# Patient Record
Sex: Female | Born: 1935 | Race: White | Hispanic: No | Marital: Married | Smoking: Former smoker
Health system: Southern US, Community
[De-identification: ages and names within clinical notes are randomized; demographics above are authoritative.]

## PROBLEM LIST (undated history)

## (undated) DIAGNOSIS — F329 Major depressive disorder, single episode, unspecified: Secondary | ICD-10-CM

## (undated) DIAGNOSIS — F419 Anxiety disorder, unspecified: Secondary | ICD-10-CM

## (undated) DIAGNOSIS — K56609 Unspecified intestinal obstruction, unspecified as to partial versus complete obstruction: Secondary | ICD-10-CM

## (undated) DIAGNOSIS — G4730 Sleep apnea, unspecified: Secondary | ICD-10-CM

## (undated) DIAGNOSIS — F32A Depression, unspecified: Secondary | ICD-10-CM

## (undated) DIAGNOSIS — I10 Essential (primary) hypertension: Secondary | ICD-10-CM

## (undated) DIAGNOSIS — R911 Solitary pulmonary nodule: Secondary | ICD-10-CM

## (undated) DIAGNOSIS — E782 Mixed hyperlipidemia: Secondary | ICD-10-CM

## (undated) DIAGNOSIS — M1990 Unspecified osteoarthritis, unspecified site: Secondary | ICD-10-CM

## (undated) DIAGNOSIS — G309 Alzheimer's disease, unspecified: Secondary | ICD-10-CM

## (undated) DIAGNOSIS — F0280 Dementia in other diseases classified elsewhere without behavioral disturbance: Secondary | ICD-10-CM

## (undated) DIAGNOSIS — J449 Chronic obstructive pulmonary disease, unspecified: Secondary | ICD-10-CM

## (undated) DIAGNOSIS — K589 Irritable bowel syndrome without diarrhea: Secondary | ICD-10-CM

## (undated) HISTORY — DX: Irritable bowel syndrome without diarrhea: K58.9

## (undated) HISTORY — DX: Chronic obstructive pulmonary disease, unspecified: J44.9

## (undated) HISTORY — DX: Sleep apnea, unspecified: G47.30

## (undated) HISTORY — PX: COLONOSCOPY: SHX174

## (undated) HISTORY — DX: Essential (primary) hypertension: I10

## (undated) HISTORY — DX: Unspecified intestinal obstruction, unspecified as to partial versus complete obstruction: K56.609

## (undated) HISTORY — DX: Solitary pulmonary nodule: R91.1

## (undated) HISTORY — PX: ROTATOR CUFF REPAIR: SHX139

## (undated) HISTORY — DX: Anxiety disorder, unspecified: F41.9

## (undated) HISTORY — DX: Dementia in other diseases classified elsewhere, unspecified severity, without behavioral disturbance, psychotic disturbance, mood disturbance, and anxiety: F02.80

## (undated) HISTORY — DX: Depression, unspecified: F32.A

## (undated) HISTORY — PX: BILATERAL OOPHORECTOMY: SHX1221

## (undated) HISTORY — DX: Major depressive disorder, single episode, unspecified: F32.9

## (undated) HISTORY — DX: Alzheimer's disease, unspecified: G30.9

## (undated) HISTORY — DX: Unspecified osteoarthritis, unspecified site: M19.90

## (undated) HISTORY — DX: Mixed hyperlipidemia: E78.2

---

## 1972-07-18 HISTORY — PX: ABDOMINAL HYSTERECTOMY: SHX81

## 1998-10-07 ENCOUNTER — Ambulatory Visit (HOSPITAL_COMMUNITY): Admission: RE | Admit: 1998-10-07 | Discharge: 1998-10-07 | Payer: Self-pay | Admitting: Neurosurgery

## 1998-10-07 ENCOUNTER — Encounter: Payer: Self-pay | Admitting: Neurosurgery

## 1998-10-16 ENCOUNTER — Encounter: Payer: Self-pay | Admitting: Neurosurgery

## 1998-10-20 ENCOUNTER — Inpatient Hospital Stay (HOSPITAL_COMMUNITY): Admission: RE | Admit: 1998-10-20 | Discharge: 1998-10-21 | Payer: Self-pay | Admitting: Neurosurgery

## 1998-10-20 ENCOUNTER — Encounter: Payer: Self-pay | Admitting: Neurosurgery

## 2001-07-18 HISTORY — PX: CHOLECYSTECTOMY: SHX55

## 2002-04-04 ENCOUNTER — Encounter: Payer: Self-pay | Admitting: Family Medicine

## 2002-04-04 ENCOUNTER — Ambulatory Visit (HOSPITAL_COMMUNITY): Admission: RE | Admit: 2002-04-04 | Discharge: 2002-04-04 | Payer: Self-pay | Admitting: Family Medicine

## 2002-04-16 ENCOUNTER — Encounter: Payer: Self-pay | Admitting: Family Medicine

## 2002-04-16 ENCOUNTER — Ambulatory Visit (HOSPITAL_COMMUNITY): Admission: RE | Admit: 2002-04-16 | Discharge: 2002-04-16 | Payer: Self-pay | Admitting: Family Medicine

## 2002-06-06 ENCOUNTER — Ambulatory Visit (HOSPITAL_COMMUNITY): Admission: RE | Admit: 2002-06-06 | Discharge: 2002-06-06 | Payer: Self-pay | Admitting: Cardiology

## 2003-05-12 ENCOUNTER — Encounter: Payer: Self-pay | Admitting: Family Medicine

## 2003-05-12 ENCOUNTER — Ambulatory Visit (HOSPITAL_COMMUNITY): Admission: RE | Admit: 2003-05-12 | Discharge: 2003-05-12 | Payer: Self-pay | Admitting: Family Medicine

## 2003-07-16 ENCOUNTER — Ambulatory Visit (HOSPITAL_COMMUNITY): Admission: RE | Admit: 2003-07-16 | Discharge: 2003-07-16 | Payer: Self-pay | Admitting: Family Medicine

## 2003-08-19 ENCOUNTER — Inpatient Hospital Stay (HOSPITAL_BASED_OUTPATIENT_CLINIC_OR_DEPARTMENT_OTHER): Admission: RE | Admit: 2003-08-19 | Discharge: 2003-08-19 | Payer: Self-pay | Admitting: *Deleted

## 2003-12-08 ENCOUNTER — Encounter: Admission: RE | Admit: 2003-12-08 | Discharge: 2004-03-07 | Payer: Self-pay | Admitting: Anesthesiology

## 2004-08-26 ENCOUNTER — Ambulatory Visit (HOSPITAL_COMMUNITY): Admission: RE | Admit: 2004-08-26 | Discharge: 2004-08-26 | Payer: Self-pay | Admitting: Family Medicine

## 2004-09-02 ENCOUNTER — Ambulatory Visit (HOSPITAL_COMMUNITY): Admission: RE | Admit: 2004-09-02 | Discharge: 2004-09-02 | Payer: Self-pay | Admitting: Neurology

## 2004-09-20 ENCOUNTER — Encounter: Admission: RE | Admit: 2004-09-20 | Discharge: 2004-09-20 | Payer: Self-pay | Admitting: Oncology

## 2004-09-20 ENCOUNTER — Encounter (HOSPITAL_COMMUNITY): Admission: RE | Admit: 2004-09-20 | Discharge: 2004-10-20 | Payer: Self-pay | Admitting: Oncology

## 2004-09-20 ENCOUNTER — Ambulatory Visit (HOSPITAL_COMMUNITY): Payer: Self-pay | Admitting: Oncology

## 2004-10-15 ENCOUNTER — Ambulatory Visit: Payer: Self-pay | Admitting: Hematology & Oncology

## 2004-11-09 ENCOUNTER — Ambulatory Visit (HOSPITAL_COMMUNITY): Admission: RE | Admit: 2004-11-09 | Discharge: 2004-11-09 | Payer: Self-pay | Admitting: Hematology & Oncology

## 2004-11-09 ENCOUNTER — Encounter (INDEPENDENT_AMBULATORY_CARE_PROVIDER_SITE_OTHER): Payer: Self-pay | Admitting: Specialist

## 2004-11-11 ENCOUNTER — Ambulatory Visit: Payer: Self-pay | Admitting: Hematology & Oncology

## 2005-12-29 IMAGING — US US SOFT TISSUE HEAD/NECK
1 series · 14 of 25 positions shown · non-contrast
Comparison: none

CLINICAL DATA: Neck mass.  Evaluate for thyroid nodule. 
THYROID ULTRASOUND:
The thyroid gland is normal in size with the right lobe measuring 4.1 x 1 x 1.4 cm and the left lobe measuring 4.1 x 1 x 1 cm.  The isthmus measures 2 mm in thickness, which is normal. 
Homogeneous echo texture of the thyroid gland is seen.  No thyroid nodules or masses are identified within either thyroid lobe.  No abnormalities are seen in the adjacent extra-thyroidal soft tissues.

[Series 1: unknown · 0.07mm/px · 14 of 26 slices shown]
[im 1/26]
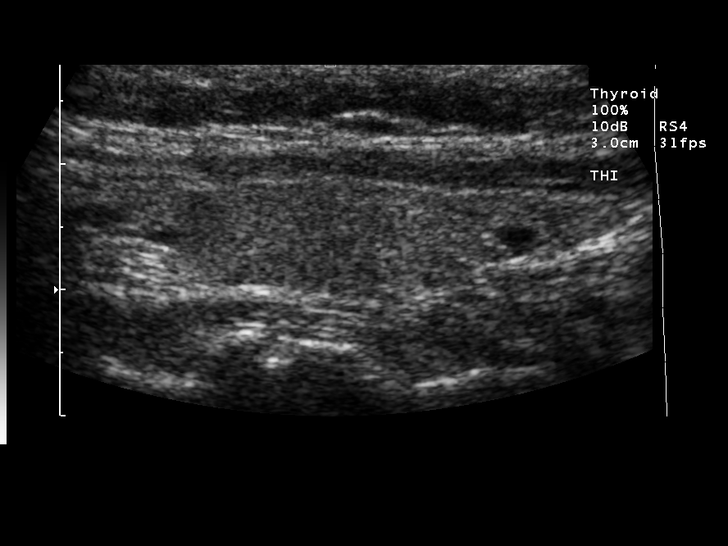
[im 3/26]
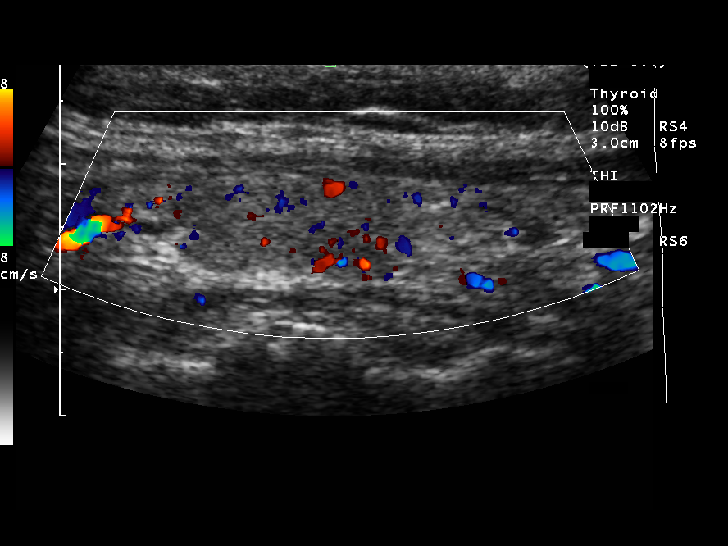
[im 5/26]
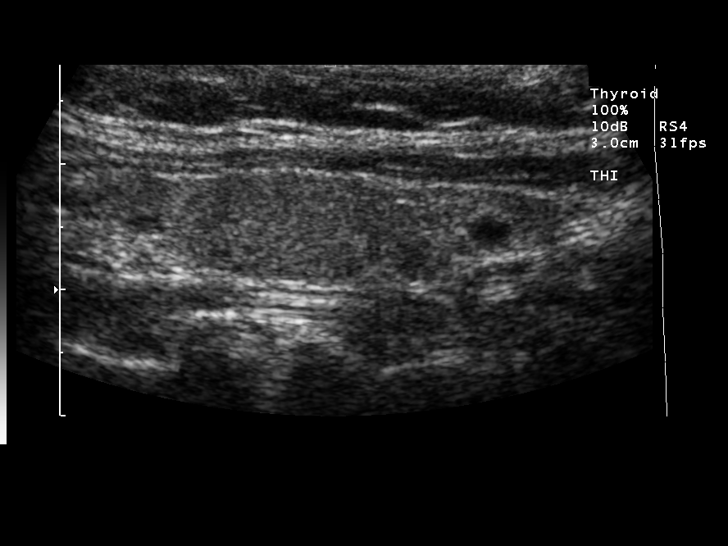
[im 7/26]
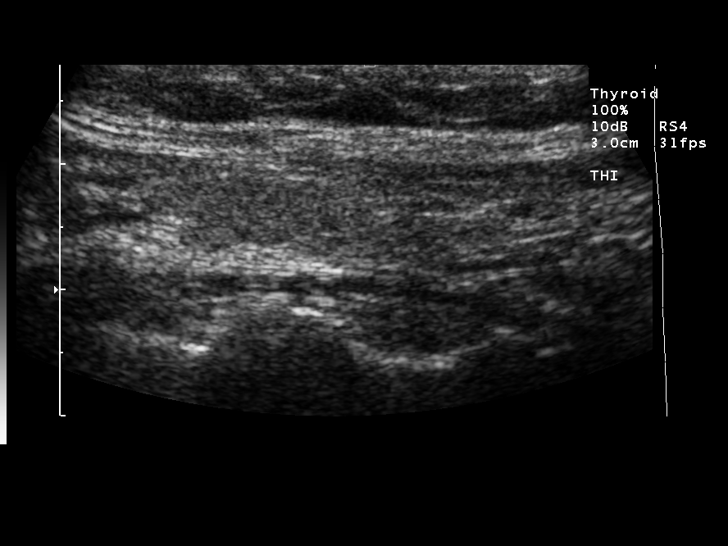
[im 9/26]
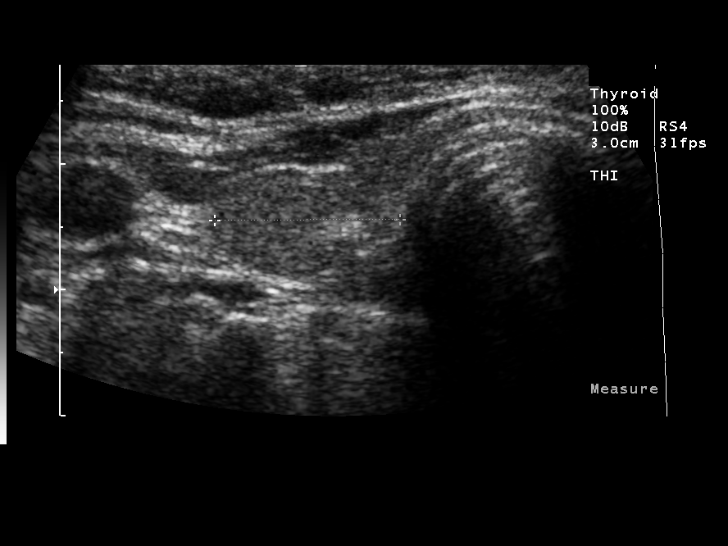
[im 10/26]
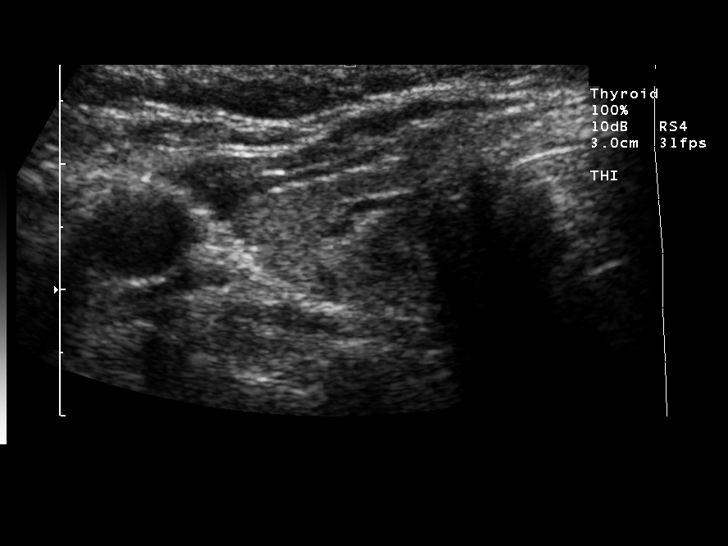
[im 12/26]
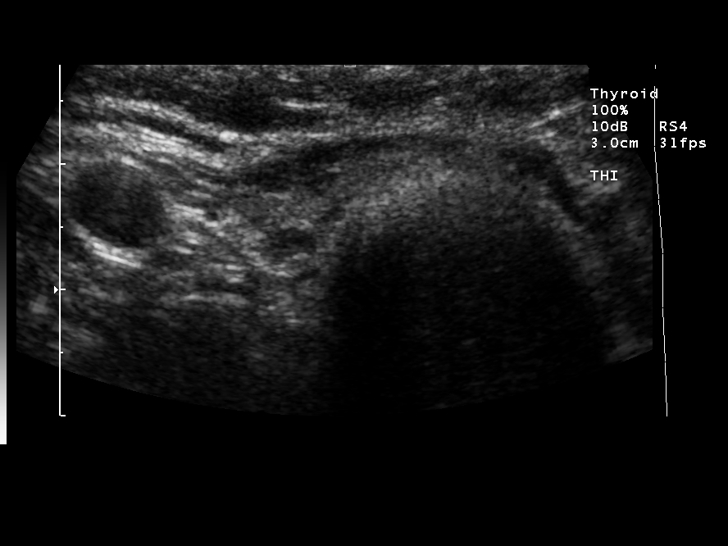
[im 14/26]
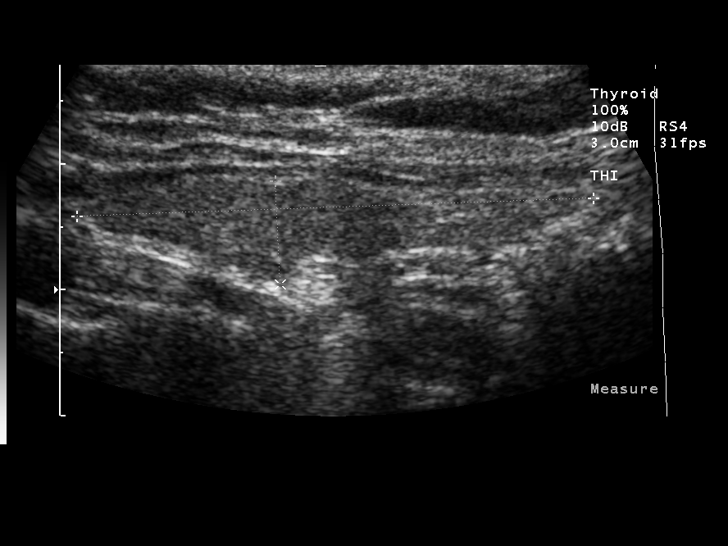
[im 16/26]
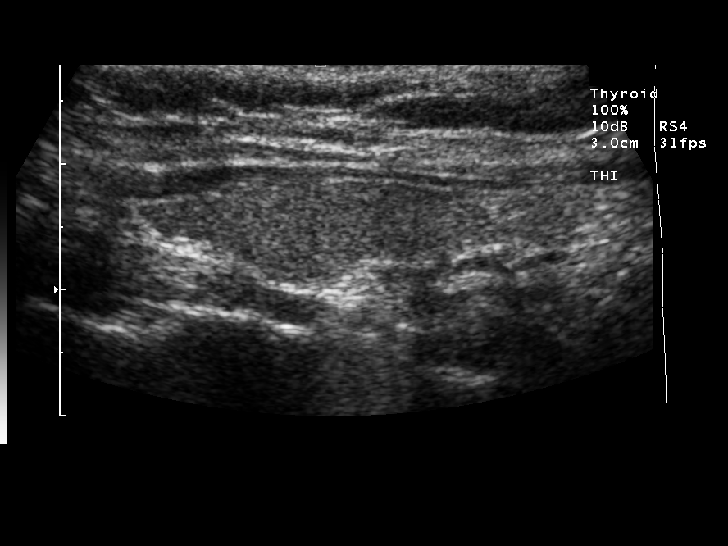
[im 17/26]
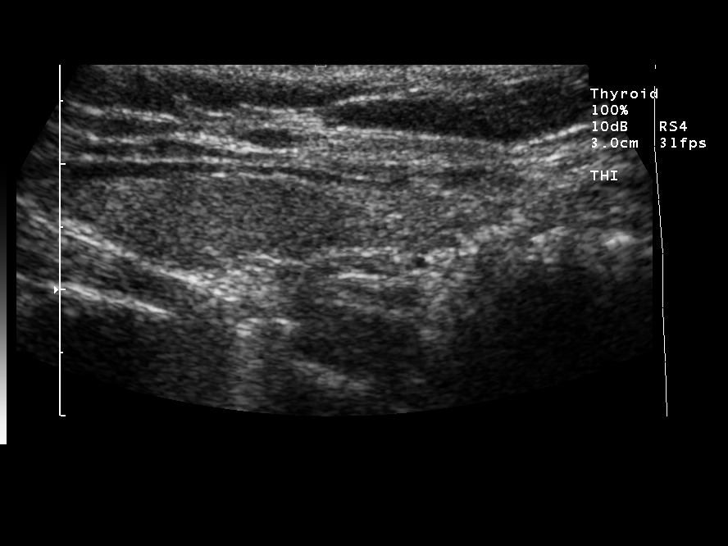
[im 19/26]
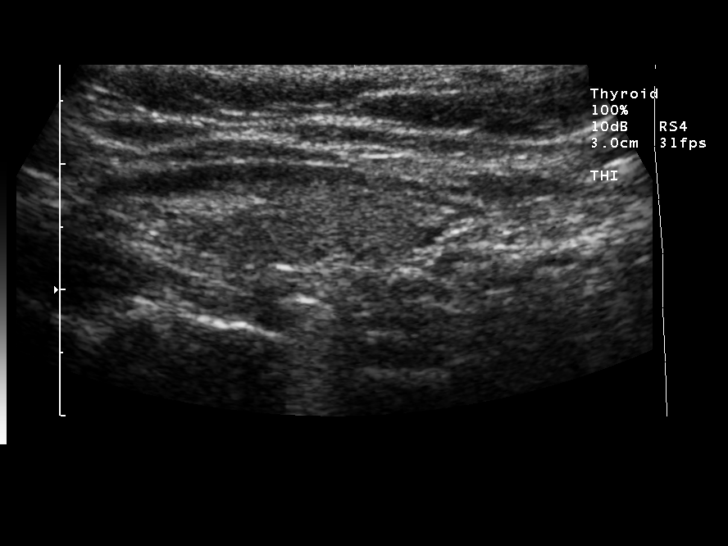
[im 21/26]
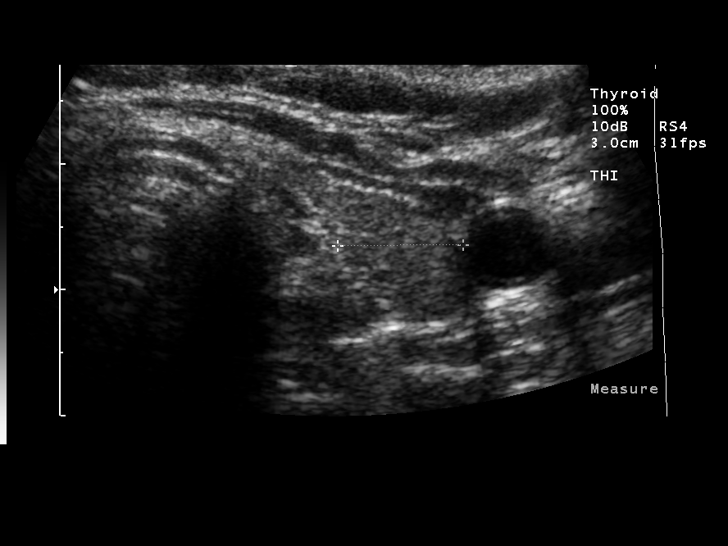
[im 23/26]
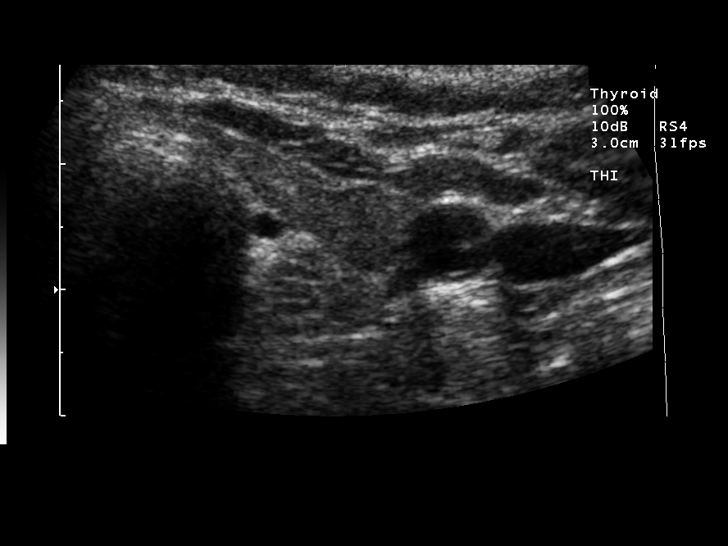
[im 26/26]
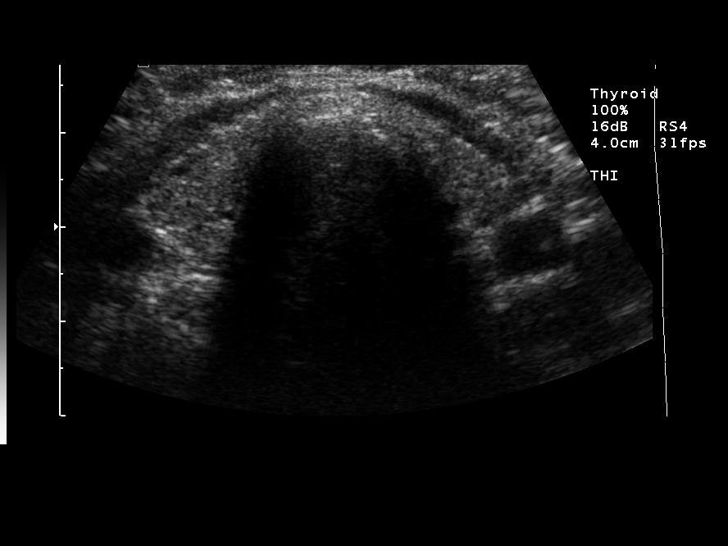

[14 of 25 positions shown; findings below may reference images not displayed]

IMPRESSION: Normal size and appearance of the thyroid gland.  If there is a persistent palpable abnormality in the neck, CT is recommended for further evaluation.

## 2006-01-19 IMAGING — CT CT CHEST W/ CM
1 of 4 series · 11 of 32 positions shown, 17 images · IV contrast (omnipaque)
Comparison: None.

CT CHEST

CLINICAL DATA: Weight loss, night sweats. Surgical history includes
cholecystectomy, hysterectomy and appendectomy.

CT OF THE CHEST, ABDOMEN, AND PELVIS WITH CONTRAST  09/23/2004:
TECHNIQUE: Multidetector CT imaging of the chest, abdomen, and pelvis was
performed during bolus administration of intravenous contrast. Oral contrast was
given. Delayed imaging through the kidneys and bladder was performed.
Contrast:  150 cc Omnipaque 300

[Series 2455: — · axial · 0.75mm/px · z∈[+1274,+1654]mm · 11 of 92 slices shown, 17 images]
[im 8/92  soft-tissue]
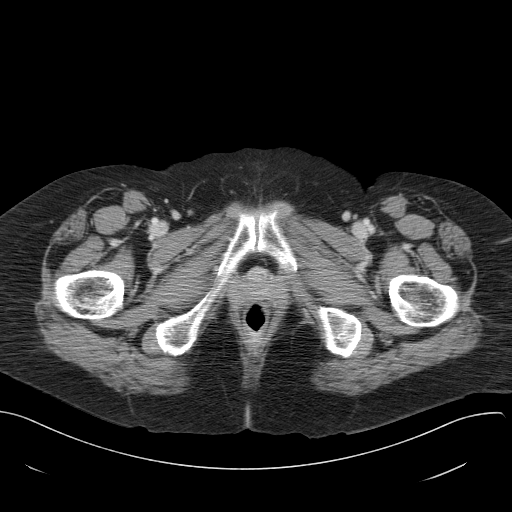
[im 8/92  bone]
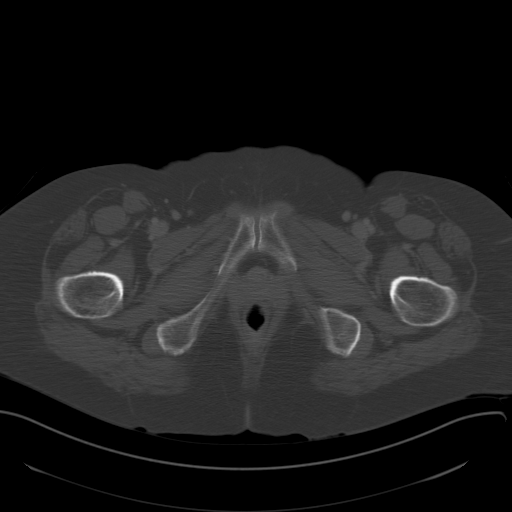
[im 16/92  soft-tissue]
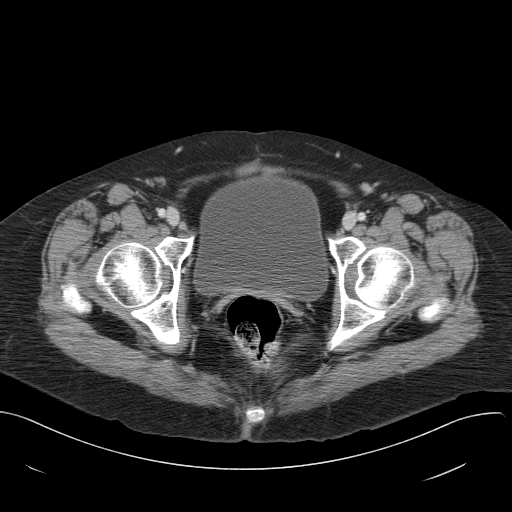
[im 23/92  soft-tissue]
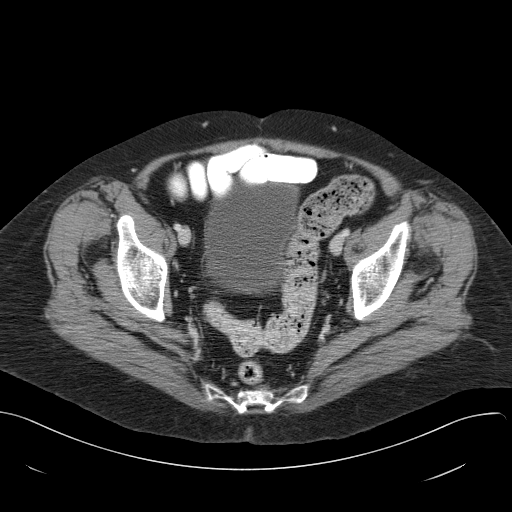
[im 31/92  soft-tissue]
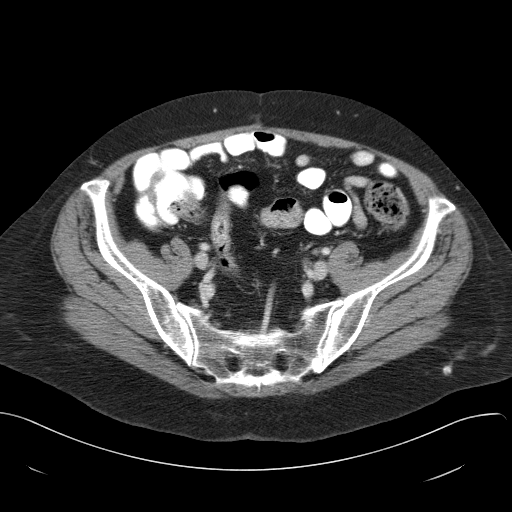
[im 38/92  soft-tissue]
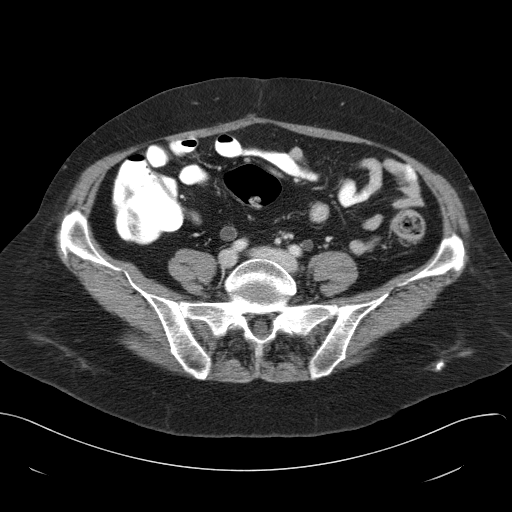
[im 46/92  soft-tissue]
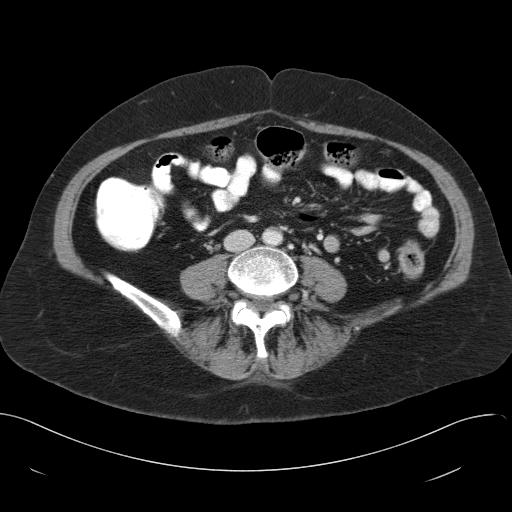
[im 54/92  soft-tissue]
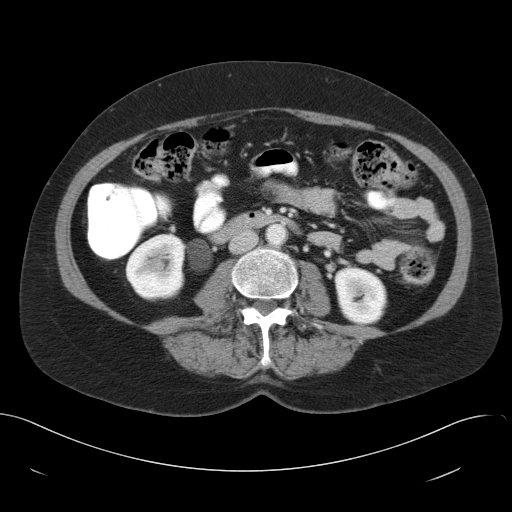
[im 61/92  soft-tissue]
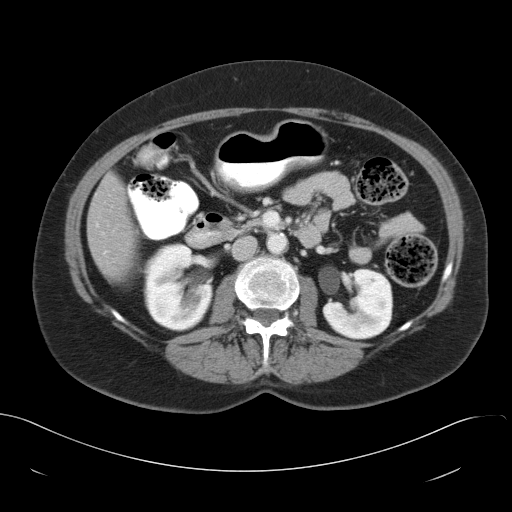
[im 61/92  lung]
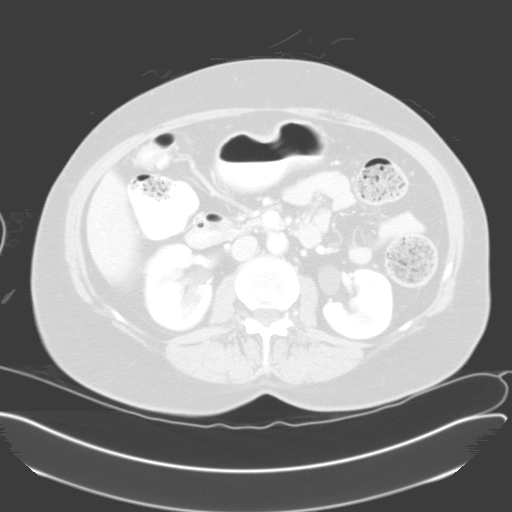
[im 69/92  soft-tissue]
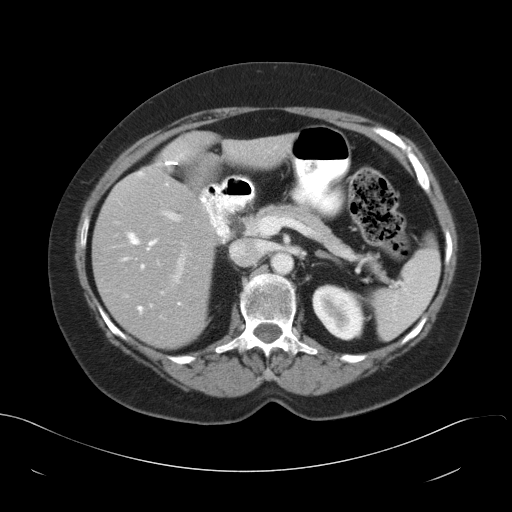
[im 69/92  lung]
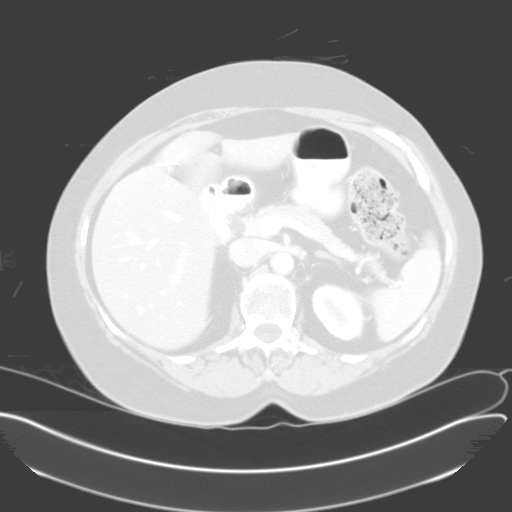
[im 69/92  bone]
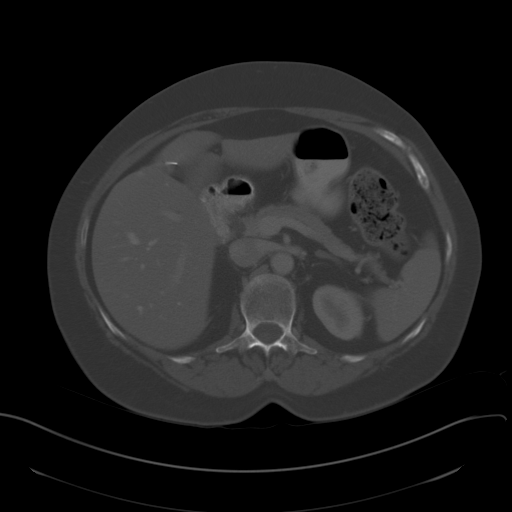
[im 76/92  soft-tissue]
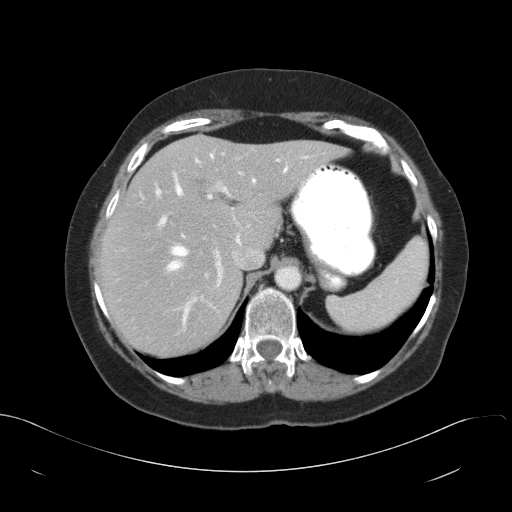
[im 76/92  lung]
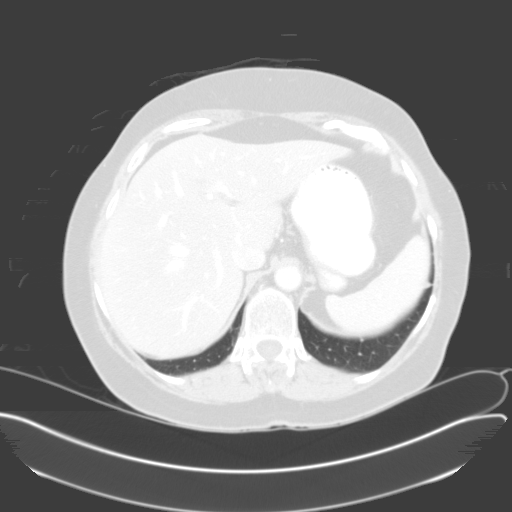
[im 84/92  soft-tissue]
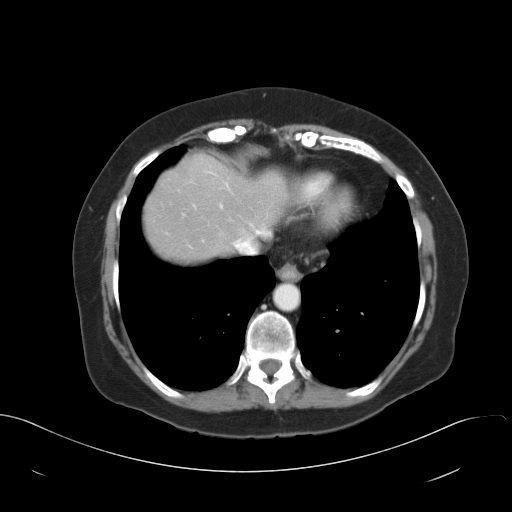
[im 84/92  lung]
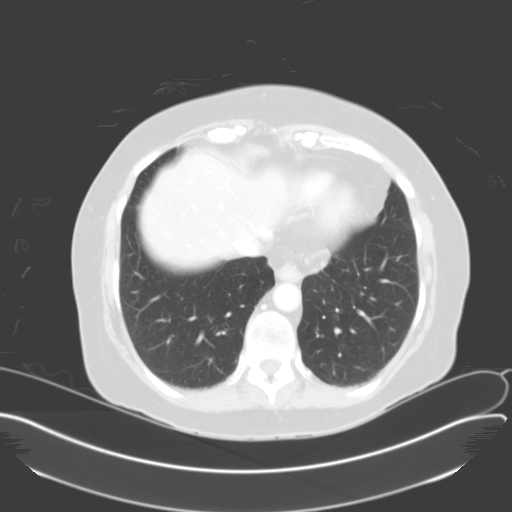

[11 of 32 positions shown; findings below may reference images not displayed]

FINDINGS: There is no significant mediastinal, hilar, or axillary
lymphadenopathy. Heart size is upper normal. There is no pericardial effusion.
There are no pleural effusions.

Emphysematous changes are present throughout both lungs. There is a nodule in
the right middle lobe which measures approximately 8 mm. No other nodules are
identified. The lungs are clear otherwise. The bone window images demonstrate
thoracic spondylosis and old healed fractures involving the left 7th, 8th and
9th anterior ribs.
IMPRESSION: 1. Solitary nonspecific nodule in the right middle lobe measuring 8 mm. This is
at the lower limits of detectability by PET. This would also be a difficult
nodule for percutaneous biopsy.

2. Emphysema. No acute cardiopulmonary disease otherwise.

CT ABDOMEN
FINDINGS: A small hiatal hernia is present. The visualized colon and small
bowel are unremarkable. There is mild diffuse fatty infiltration of the liver.
There is a calcified granuloma in the anterior segment of the right lobe near
the dome. No significant focal abnormalities are identified in the liver. The
spleen and pancreas are normal in appearance. Both adrenal glands are normal.
There are tiny simple cysts in the mid left kidney and in the upper pole of the
right kidney. Extrarenal pelves are present bilaterally. There are no
significant abnormalities involving either kidney. There is no significant
lymphadenopathy. Minimal aortic atherosclerosis is present. There is no free
fluid.
IMPRESSION: 1. Small hiatal hernia. 

2. Mild diffuse fatty infiltration of the liver.

3. No significant abnormalities otherwise.

CT PELVIS
FINDINGS: There is a large amount of stool present in the rectosigmoid colon.
The small bowel is unremarkable. The uterus is surgically absent. No pelvic
masses are identified. The urinary bladder is normal. No free fluid is present.
There is a calcified injection granuloma in the left buttock.
IMPRESSION: No significant abnormalities in the pelvis apart from probable constipation.

## 2008-11-18 ENCOUNTER — Ambulatory Visit: Payer: Self-pay | Admitting: Cardiology

## 2008-11-21 ENCOUNTER — Inpatient Hospital Stay (HOSPITAL_BASED_OUTPATIENT_CLINIC_OR_DEPARTMENT_OTHER): Admission: RE | Admit: 2008-11-21 | Discharge: 2008-11-21 | Payer: Self-pay | Admitting: Cardiovascular Disease

## 2008-11-21 ENCOUNTER — Ambulatory Visit: Payer: Self-pay | Admitting: Cardiovascular Disease

## 2008-12-09 ENCOUNTER — Ambulatory Visit: Payer: Self-pay | Admitting: Cardiology

## 2009-04-10 DIAGNOSIS — R0602 Shortness of breath: Secondary | ICD-10-CM | POA: Insufficient documentation

## 2009-04-10 DIAGNOSIS — R079 Chest pain, unspecified: Secondary | ICD-10-CM

## 2010-08-08 ENCOUNTER — Encounter: Payer: Self-pay | Admitting: Family Medicine

## 2010-10-26 LAB — POCT I-STAT 3, VENOUS BLOOD GAS (G3P V)
Acid-base deficit: 3 mmol/L — ABNORMAL HIGH (ref 0.0–2.0)
Bicarbonate: 23.7 mEq/L (ref 20.0–24.0)
O2 Saturation: 62 %
pCO2, Ven: 45.7 mmHg (ref 45.0–50.0)
pO2, Ven: 35 mmHg (ref 30.0–45.0)

## 2010-10-26 LAB — POCT I-STAT 3, ART BLOOD GAS (G3+)
TCO2: 26 mmol/L (ref 0–100)
pCO2 arterial: 40.6 mmHg (ref 35.0–45.0)
pH, Arterial: 7.393 (ref 7.350–7.400)

## 2010-11-30 NOTE — Cardiovascular Report (Signed)
Madeline Hayes, DLUGOSZ                 ACCOUNT NO.:  [id number]   MEDICAL RECORD NO.:  [id number]          PATIENT TYPE:  OIB   LOCATION:  1961                         FACILITY:  [facility]   PHYSICIAN:  Verne Carrow, MDDATE OF BIRTH:  1935/12/26   DATE OF PROCEDURE:  DATE OF DISCHARGE:  11/21/2008                            CARDIAC CATHETERIZATION   PRIMARY CARDIOLOGIST:  Jonelle Sidle, MD   PRIMARY CARE PHYSICIAN:  Dr. Linward Foster.   PROCEDURE PERFORMED:  1. Right heart catheterization.  2. Left heart catheterization.  3. Left coronary angiography.  4. Left ventricular angiogram.   OPERATOR:  Verne Carrow, MD   INDICATIONS:  Shortness of breath in a 75 year old patient with no known  prior cardiac disease who has a history of COPD.  The right and left  heart catheterization has been ordered today to rule out obstructive  coronary artery disease to assess for pulmonary artery pressures  basically.   DETAILS OF PROCEDURE:  The patient was brought into the outpatient  cardiac catheterization laboratory after signing informed consent for  the procedure.  The right groin was prepped and draped in a sterile  fashion.  Lidocaine 1% was used for local anesthesia.  A 4-French sheath  was inserted into the right femoral artery.  A 7-French sheath was  inserted into the right femoral vein.  A Swan-Ganz catheter was used to  perform the right heart catheterization.  Standard diagnostic catheters  were used to perform the selective coronary angiography.  A 4-French  pigtail catheter was used to perform a left ventricular angiogram.  There was no significant pressure gradient noted across the aortic valve  on pullback of the catheter.  The patient tolerated the procedure well  and was taken to the holding area in stable condition.   HEMODYNAMIC FINDINGS:  Right atrial pressure 9.  Right ventricular  pressure 47/7.  Right ventricular end-diastolic pressure 15.  Pulmonary  artery pressure 43/18 with a mean of 29.  Pulmonary capillary wedge  pressure mean of 17.  Left ventricular pressure 138/18.  Left  ventricular end-diastolic pressure 22.  Central aortic pressure 134/68.  Cardiac output 4.5 L per minute (by Fick method).  Cardiac index 2.2 L  per minute per meter squared.  Aortic saturation 93%.  Pulmonary artery  saturation 62%.   ANGIOGRAPHIC FINDINGS:  1. The left main coronary artery has no evidence of disease.  2. Left anterior descending artery is a large vessel that courses to      the apex and gives off a large early diagonal branch.  There is no      obstructive disease noted in the diagonal branch or in the LAD      system.  3. The circumflex artery is a moderate-sized vessel that gives off a      moderate-sized first marginal branch, a small second marginal      branch, and a moderate-sized third marginal branch.  There is no      obstructive disease noted at this system.  4. The right coronary artery is small vessel that for provides small  posterior descending artery.  There is no obstructive disease noted      at this system.  5. Left ventricular angiogram was performed in the RAO projection that      shows normal left ventricular systolic function with ejection      fraction of 65%.  There was no evidence of significant mitral      regurgitation.   IMPRESSION:  1. No angiographic evidence of coronary artery disease.  2. Normal left ventricular systolic function.  3. Mild elevation of pulmonary artery pressures.   RECOMMENDATIONS:  I do not recommend any further cardiac workup at this  time.  The patient will have follow up arranged with Dr. Diona Browner in 1-2  weeks.      Verne Carrow, MD  Electronically Signed     CM/MEDQ  D:  11/21/2008  T:  11/22/2008  Job:  [id number]   cc:   Jonelle Sidle, MD

## 2010-11-30 NOTE — Assessment & Plan Note (Signed)
[facility]                          [facility]ICE NOTE   JONNI, OELKERS                        MRN:          [id number]  DATE:11/18/2008                            DOB:          [birth year]    PRIMARY CARE PHYSICIAN:  Linward Foster, M.D.   REASON FOR CONSULTATION:  Progressive shortness of breath and  intermittent chest pain.   HISTORY OF PRESENT ILLNESS:  Madeline Hayes is a 75 year old woman with a  history of emphysematous lung disease, hypertension, hyperlipidemia, and  depression who was evaluated by our practice initially back in 2003.  She had undergone a noninvasive myocardial perfusion study at that time  that demonstrated possible mild lateral ischemia and ultimately  underwent a diagnostic cardiac catheterization in November of 2003.  This study revealed no significant flow-limiting coronary  atherosclerosis in the major epicardial vessels with an ejection  fraction of 70-75%.  She was subsequently evaluated in 2005 with a right  heart catheterization showing only mildly elevated pulmonary artery  systolic pressure of 39 with mean pulmonary capillary wedge pressure of  9, normal cardiac output of 4.8 liters per minute, and no evidence of  constriction or restriction based on simultaneous LV and RV pressures.  She had been referred at that time with a history of progressive  shortness of breath.   Since then, we have not seen her with any regularity, and she has been  followed by Dr. Gerhard Munch and Dr. Egbert Garibaldi.  The patient states that over the  last 3 years she has been experiencing progressive shortness of  breath, as well as intermittent chest pain.  She describes a very  sporadic chest discomfort, moderate in intensity, and lasting anywhere  from 15-20 minutes.  This is described as a pressure, and sometimes it  improves with her DuoNeb treatments; not consistently, however.  These  symptoms are not purely exertional.  She states  that she becomes  exhausted when she does activities of daily living such as working  around her house and making of her bed.  She had an electrocardiogram  done recently showing normal sinus rhythm with nonspecific ST-T wave  changes.  I see that she is also being followed for a right middle lobe  nodule that has been stable, most recently assessed by CT angiography on  October 20, 2008.  She has an 8 mm right middle lobe nodule without any  mediastinal or hilar lymphadenopathy.  Lung parenchyma imaging shows  evidence of central lobular emphysema.  She is not reporting any cough  or hemoptysis.  She does report being under a lot of stress and has  trouble with anxiety, as well.   ALLERGIES:  1. PENICILLIN.  2. CODEINE.  3. SULFA DRUGS.   PRESENT MEDICATIONS:  1. Prozac 20 mg p.o. daily.  2. Verapamil SR 240 mg p.o. daily.  3. Bumex 1 mg p.o. daily.  4. Potassium 10 mEq p.o. daily.  5. Requip 1 mg p.o. daily.  6. Amitriptyline 10 mg p.o. daily.  7. Zocor 20 mg p.o. daily.  8. Aricept 10 mg p.o. daily.  9. Aspirin 81 mg p.o. daily.  10.Caltrate 500 mg p.o. b.i.d.  11.Vitamin C 1000 mg p.o. b.i.d.  12.Magnesium 250 mg p.o. daily.  13.Omega-3 supplements 1000 mg p.o. daily.  14.Vitamin D 400 international units p.o. daily.  15.DuoNeb treatments four to six times a day p.r.n.  16.Motrin p.r.n.   REVIEW OF SYSTEMS:  As outlined above.  She has rare palpitations,  migraine headaches, reportedly sleep apnea symptoms, occasional lower  extremity edema, some memory problems, anxiety and depression.  She  wears dentures.  No active bleeding problems.  She does report a history  of dark stools but states that she had a normal colonoscopy done by  Dr. Cleotis Nipper in follow up.  She had no frank hematochezia.  No  hematemesis.  She has occasional belching and reflux symptoms.  Otherwise reviewed and negative.   PAST MEDICAL HISTORY:  As outlined above.  1. She reports problems with mild  Alzheimer's dementia.  2. She had a hysterectomy and bilateral oophorectomy in her 30s.  3. Cholecystectomy at age 83.  4. Appendectomy at age 17.  5. Rotator cuff surgery on the right at age 57.   FAMILY HISTORY:  Reviewed.  The patient's mother died at age 64 with  congestive heart failure.  The patient's father died at age 15 of  leukemia.  She has a sibling, age 47, with no active cardiovascular  disease.  There is other family history of diabetes mellitus and  hypertension.   SOCIAL HISTORY:  The patient is retired and married.  She used to work  in Design[employer], fashion/clothing.  She has a high school education.  Her daughter died at age  45, and she has another daughter, age 61, with diabetes.  No active  alcohol use.  She has a 55 pack-year history of tobacco use, but has  quit.   PHYSICAL EXAMINATION:  VITAL SIGNS:  Blood pressure is 141/78, heart  rate is 82, weight is 202 pounds, height 5 feet 7 inches tall.  GENERAL:  This is an overweight woman in no acute distress without  active chest pain.  HEENT:  Conjunctiva and lids normal.  Oropharynx clear.  Poor dentition.  Dentures noted.  NECK:  No elevated jugular venous pressure, no loud bruits, no  thyromegaly.  LUNGS:  Diminished breath sounds.  No wheezing or labored breathing,  however.  CARDIAC:  A regular rate and rhythm.  Normal S1 and S2.  A soft apical  systolic murmur.  PMI is indistinct.  No pericardial rub.  ABDOMEN:  Soft, nontender.  Normoactive bowel sounds.  EXTREMITIES:  Exhibit trace edema.  Distal pulses 1 to 2+.  SKIN:  Warm and dry.  MUSCULOSKELETAL:  No kyphosis noted.  NEUROPSYCHIATRIC:  The patient is alert and oriented x3.   IMPRESSION AND RECOMMENDATIONS:  1. Reported chronic history of progressive shortness of breath with      intermittent chest pain.  She has typical and atypical features for      angina in the setting of previously documented reassuring cardiac      catheterization in 2003 demonstrating no  obstructive cardiovascular      disease.  She actually underwent a right heart catheterization in      2005 in the setting of shortness of breath that demonstrated only      mildly increased pulmonary artery systolic pressure and normal      wedge pressure.  She is referred back, given progressive symptoms,      and remains  very concerned about her cardiac status.  I reviewed      this in detail with her today and discussed both noninvasive and      invasive techniques for further assessment.  After reviewing the      risks and benefits, we have elected to refer her for a right and      left heart catheterization to most efficiently evaluate her      cardiopulmonary status including hemodynamics with pulmonary      pressures and also obviously her coronary anatomy.  We will plan      baseline labs prior to this, and we can make further plans from      there.  2. Known history of emphysematous lung disease with stable right      middle lobe lung nodule, followed by Dr. Kern Reap in [place],      [place].     Jonelle Sidle, MD  Electronically Signed    SGM/MedQ  DD: 11/18/2008  DT: 11/18/2008  Job #: [id number]   cc:   Kern Reap, M.D.  Linward Foster

## 2010-11-30 NOTE — Assessment & Plan Note (Signed)
[facility]                          [facility]ICE NOTE   MADELLYN, DENIO                        MRN:          [id number]  DATE:12/09/2008                            DOB:          August 11, 1935    PRIMARY CARE PHYSICIAN:  Dr. Linward Foster.   PULMONOLOGIST:  Tracey Harries. Egbert Garibaldi, MD   REASON FOR VISIT:  Postcatheterization followup.   HISTORY OF PRESENT ILLNESS:  I saw Ms. Madeline Hayes earlier in May with a  history of progressive shortness of breath and intermittent chest pain  with both typical and atypical features.  She has a history of  emphysematous lung disease as well as occupational exposure and a stable  right middle lobe lung nodule that has been followed by Dr. Egbert Garibaldi.  We  referred her for a left and right heart catheterization to best reassess  her coronary anatomy and also provide right heart pressures.  Fortunately, this study was quite reassuring from a cardiac perspective  with no angiographic evidence of significant cardiovascular disease.  Her ejection fraction was 65% with no significant mitral regurgitation.  Right heart hemodynamics revealed a right atrial pressure of 9, right  ventricular pressure of 47/7, pulmonary artery pressure of 43/18 (mean  of 29), and a pulmonary capillary wedge pressure mean of 17.  Left  ventricular pressure was 138/18 with an end-diastolic pressure of 22.  Cardiac output was 4.5 L per minute by the Fick method and she had a  pulmonary artery saturation of 62% with arterial saturation of 93%.  Followup electrocardiogram is normal showing sinus rhythm at 81 beats  per minute.  It would seem that her symptoms are perhaps more referable  to her pulmonary status given her reassuring coronary anatomy.  Would  continue, however, to make efforts at primary risk factor modification.  She is due to see Dr. Egbert Garibaldi in the office later this week.   ALLERGIES:  PENICILLIN, CODEINE, and SULFA DRUGS.   PRESENT  MEDICATIONS:  1. Prozac 20 mg p.o. daily.  2. Verapamil SR 240 mg p.o. daily.  3. Bumex 1 mg p.o. daily.  4. Potassium 10 mEq p.o. daily.  5. ReQuip 1 mg p.o. daily.  6. Amitriptyline 10 mg p.o. daily.  7. Zocor 20 mg p.o. daily.  8. Aricept 10 mg p.o. daily.  9. Aspirin 81 mg p.o. daily.  10.Caltrate 500 mg p.o. b.i.d.  11.Vitamin C and magnesium supplements.  12.Omega-3 supplements 1000 mg p.o. daily.  13.Vitamin E supplements.  14.DuoNeb p.r.n.   REVIEW OF SYSTEMS:  Outlined above.  Otherwise, reviewed and negative.   PHYSICAL EXAMINATION:  VITAL SIGNS:  Blood pressure today is 135/85,  heart rate is 82, weight is 204 pounds.  GENERAL:  The patient is comfortable in no acute distress.  CARDIAC:  A regular rate and rhythm.  Soft apical systolic murmur.  No  S3, gallop, or pericardial rub.  ABDOMEN:  Soft, nontender.  EXTREMITIES:  Trace edema.  Distal pulses 1-2+.  SKIN:  Otherwise, warm and dry.   IMPRESSION AND RECOMMENDATIONS:  1. Reassuring coronary angiogram with no evidence of  significant      coronary artery disease and overall normal left ventricular      systolic function at 65% with no mitral regurgitation.  At this      point, we would recommend continued efforts at primary risk factor      modification.  No further ischemic workup is planned.  2. 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 Kern Reap in      [place].  Right heart pressures are listed above and do show      moderate pulmonary hypertension.  She is due to see Dr. Egbert Garibaldi back      in the office later this week for further evaluation and      management.     Jonelle Sidle, MD  Electronically Signed    SGM/MedQ  DD: 12/09/2008  DT: 12/10/2008  Job #: [id number]   cc:   Tracey Harries. Egbert Garibaldi, MD  Linward Foster

## 2010-12-03 NOTE — Procedures (Signed)
NAME:  Madeline Hayes, Madeline Hayes                           ACCOUNT NO.:  [id number]   MEDICAL RECORD NO.:  [id number]                   PATIENT TYPE:  OUT   LOCATION:  RAD                                  FACILITY:  [facility]   PHYSICIAN:  [place] Bing, M.D.               DATE OF BIRTH:  1935-09-11   DATE OF PROCEDURE:  07/16/2003  DATE OF DISCHARGE:                                  ECHOCARDIOGRAM   REFERRING:  Corrie Mckusick, M.D., Jonelle Sidle, M.D. [facility]   CLINICAL DATA:  75 year old woman with CHF.   M MODE:  Aorta:  2.5.  Left atrium:  4.2.  Septum:  1.1.  Posterior wall:  0.8.  LV diastole:  4.3.  LV systole:  2.8.   1. Technically adequate echocardiographic study.  2. Mild left atrial enlargement; normal right atrial size.  Normal right     ventricular size with borderline hypertrophy and normal function.  3. Minimal aortic valvular sclerosis; trivial insufficiency.  4. Normal tricuspid valve; mild regurgitation; mild elevation in estimated     RV systolic pressure.  5. Normal mitral and pulmonic valves.  6. Normal internal dimension of the left ventricle; normal wall thickness;     normal regional and global LV systolic function.  7. Very small posterior pericardial effusion.  8. Normal IVC.      ___________________________________________                                             Bing, M.D.   RR/MEDQ  D:  07/17/2003  T:  07/17/2003  Job:  [id number]

## 2010-12-03 NOTE — Group Therapy Note (Signed)
REFERRING PHYSICIAN:  Corrie Mckusick, M.D.   Velta Rockholt [facility] to [facility] today. She was referred  to [place] by her primary care physician, Dr. Assunta Found.  She is complaining  of pain all over her body, with paracervical and paralumbar pain as well.  This seems to be most problematic, described as 8/10 on subjective scale.  She is wheelchair bound, accompanied by her husband.  Her husband states  that she is forgetful, frequently falls, and he assists her in all  activities including her medication, and answers many of her questions.  During the interview today, he was enabling, and answered for her many  times.  She turned many times to look at him for help.  She is oriented.  Nursing staff spent approximately 25 minutes reviewing health and history  form with her.  This is attached to the chart.  Her pain is directed  suprascapular, paracervical, paralumbar, bilateral extremity, and interferes  with sleep, activities of daily living and cognitive function.  She relates  everything making her worse, sitting, laying, walking, bending, and most  activities of daily living.  She is improved with medication and rest.  She  takes six to eight Tylox a day, only prescribed four a day, realizing she  exceeds, irrespective of her husband controlling her medications.  She has  recently been hospitalized for confusion and a fall.  She apparently was  placed on Duragesic during that hospitalization and did not find that it  assisted her.  She has called here prior to our visit, requesting  medication, which we did not venture into without assessment.  The nursing  is present, at no time was the physician left unattended.   States no wish to harm self or others.  Medications attached to chart. Past  medical history, social history and family history attached to chart and  reviewed.  She does have extensive social history regarding cigarette  consumption, and uses cigarettes to  help with sleep.  She also uses sleep to  help with pain.  She has been disabled since December 31, 1999.  She has been  evaluated by six physicians, including a rheumatologist with the underlying  diagnosis of fibromyalgia.  She has been seen by neurologist as well as  spinal axial surgeon.  No surgery planned.  No particular interventions by  these other physicians.   Review of systems, family and social history otherwise noncontributory to  the pain problem.   PHYSICAL EXAMINATION:  GENERAL APPEARANCE:  A female sitting with a  completely flat affect.  Oriented, assisted to the examination table by the  husband.  She arrives with her wheelchair.  HEENT:  Otherwise unremarkable.  CHEST:  Clear to auscultation and percussion.  Increased AP diameter.  CARDIOVASCULAR:  Regular rate and rhythm without murmurs, rubs, or gallops.  ABDOMEN:  Obese, soft, nontender, no hepatosplenomegaly.  NEUROLOGIC:  She has diffuse suprascapular, paracervical, paralumbar  myofascial discomfort distractible to examination.  Pain on flexion and  extension of cervical and lumbar spine.  Spurling's test positive, right  greater than left.  She has pain over PSIS.  Identify no specific muscle  group weakness, overall neurological deficit, motor, sensory, reflexes.   IMPRESSION:  1. Fibromyalgic, doubt classic fibromyalgia, spinal axial disease.  2. Degenerative spinal disease cervical and lumbar spine.  3. Poor health characteristics, cigarette use, probable narcotic exposure,     doubt habituation.   PLAN:  1. She has not had any pain  medication in a number of days and I do not see     any evidence of withdrawal.  As I discussed with her, this is a narcotic     resistant problem if she does truly have fibromyalgia, and I choose non-     narcotic medication  alternatives as best approach.  We will emphasize     wellness, cigarette cessation.  We will start her on physical therapy and     I will order a TENS  unit.  2. I will start her on Keppra low dose for central amplification and     tramadol for symptom control.  There is potential for interaction between     Prozac and tramadol, when I think the risks reward benefit is in her     favor, rather than move to the narcotic arena.  3. I will probably refer her on to a psychiatrist.  I think that that will     be an important co-managing physician, I would consider this in follow-     up.  4. See her in about three or four weeks.  She will maintain contact with her     primary care physician.  I recommend that they work collaboratively to     work toward cigarette cessation.   Extensive consultation.  Discharge instructions given.  Review of available  records.  Patient is discharged with nurses' teaching and no barriers to  communication.     Celene Kras, MD   HH/MedQ  D:  12/09/2003 11:24:28  T:  12/09/2003 11:46:20  Job #:  [id number]   cc:   Corrie Mckusick, M.D.  [address] [place] [place]  [place]  [place] [address]  Fax: [phone]

## 2010-12-03 NOTE — Cardiovascular Report (Signed)
NAME:  Madeline Hayes, Madeline Hayes                           ACCOUNT NO.:  [id number]   MEDICAL RECORD NO.:  [id number]                   PATIENT TYPE:  OIB   LOCATION:  2855                                 FACILITY:  [facility]   PHYSICIAN:  Jonelle Sidle, M.D. [facility]        DATE OF BIRTH:  [birth year]   DATE OF PROCEDURE:  06/06/2002  DATE OF DISCHARGE:                              CARDIAC CATHETERIZATION   INDICATION:  The patient is a pleasant 75 year old woman with a history of  hypertension, tobacco abuse, and possible chronic obstructive pulmonary  disease, who presents for evaluation of dyspnea on exertion, intermittent  chest discomfort, and to further evaluate an abnormal Cardiolite suggesting  potential mild lateral ischemia. Coronary angiography is requested to  clearly define the coronary anatomy.   PROCEDURES:  1. Left heart catheterization.  2. Selective coronary angiography.  3. Left ventriculography.   CARDIAC ASSESSMENT:  Jonelle Sidle, M.D.   ACCESS AND EQUIPMENT:  The area about the right femoral artery was  anesthetized with 1% lidocaine, and a 6-French sheath was placed in the  right femoral artery via modified Seldinger technique.  Standard preformed 6-  [place] and JR4 catheters were used for selective coronary angiography,  and an angled pigtail catheter was used for left heart catheterization and  left ventriculography.  All exchanges were made over a wire, and the patient  tolerated the procedure well without complications.   HEMODYNAMICS:  Left ventricle 158/20 mmHg (post angiography), aorta 158/75  mmHg.   ANGIOGRAPHIC FINDINGS:  1. Left main coronary artery  is free of significant flow-limiting coronary     atherosclerosis.  2. Left anterior descending is a medium to large caliber vessel with a large     proximal diagonal branch.  There is ano significant flow-limiting     coronary atherosclerosis within this system.  3. The circumflex coronary artery  is a large vessel with three obtuse     marginal branches.  There is no significant flow-limiting coronary     atherosclerosis within this system.  4. The right coronary artery is a small vessel that provides a small     posterior descending branch.  There is no significant flow-limiting     coronary atherosclerosis within this system.   LEFT VENTRICULOGRAPHY:  Left ventriculography was performed in the RAO  projection and reveals an ejection fraction of 70 to 75% with no focal wall  motion abnormalities and no significant mitral regurgitation.   DIAGNOSES:  1. No significant flow-limiting coronary atherosclerosis within the major     epicardial vessels.  2. Left ventricular ejection fraction 70 to 75% without mitral regurgitation     and no significant gradient on aortic valve pullback.  The left     ventricular end-diastolic pressure is mildly elevated post angiography.    RECOMMENDATIONS:  Would continue aggressive risk factor modification.  I  discussed smoking cessation with the patient.  Perhaps  further investigation  into the potential for chronic lung disease would be a next step.  Pulmonary  function testing would be reasonable.                                               Jonelle Sidle, M.D. [facility]    SGM/MEDQ  D:  06/06/2002  T:  06/06/2002  Job:  [id number]   cc:   Patrica Duel, M.D.  [address]  [place]  [place] [address]  Fax: [phone]

## 2010-12-03 NOTE — Cardiovascular Report (Signed)
NAME:  Madeline Hayes, Madeline Hayes                           ACCOUNT NO.:  [id number]   MEDICAL RECORD NO.:  [id number]                   PATIENT TYPE:  OIB   LOCATION:  6501                                 FACILITY:  [facility]   PHYSICIAN:  Veneda Melter, M.D.                   DATE OF BIRTH:  1936/03/18   DATE OF PROCEDURE:  08/19/2003  DATE OF DISCHARGE:                              CARDIAC CATHETERIZATION   PROCEDURES PERFORMED:  1. Right heart catheterization.  2. Left heart catheterization.   DIAGNOSES:  Mild pulmonary hypertension with normal left and right heart  filling pressures with normal right heart cardiac output.   HISTORY:  Madeline Hayes is a 75 year old white female who presents with  progressive shortness of breath that has become lifestyle limiting.  The  patient has undergone left heart catheterization showing trivial coronary  artery disease.  She presents for further assessment of right filling  pressures and right heart output.   DESCRIPTION OF PROCEDURE:  Informed consent was obtained.  The patient was  brought to interventional lab.  A 7 French venous and 4 French arterial  sheath wee placed in the right groin using modified Seldinger technique.  A  7 [place] PA catheter was advanced in the pulmonary artery and appropriate  right-sided hemodynamics were obtained.  A 4 French multipurpose catheter  was advanced into the left ventricle, and appropriate left-sided  hemodynamics were obtained.  Cardiac output was then measured using the  thermodilution method.  At the termination of the case, the catheters and  sheaths were removed, and manual pressure applied until adequate hemostasis  was achieved.  The patient tolerated the procedure well and was transferred  to the floor in stable condition.   FINDINGS:  1. RA is 9/6, mean is 5.  RV is 38/1.  PA equals 39/13.  2. Pulmonary capillary wedge pressure is 14/11, mean equals 9.  3. Cardiac output is 4.8 liters per minute with cardiac  index of 2.6 liters     per minute per sq m by thermodilution method.  4. LV pressure is 149/4.  5. Aortic pressure is 145/72.  6. Left ventricular end-diastolic pressure equals 11.  7. There is no significant mitral valve gradient, no evidence of     constriction or restrictive etiology noted on simultaneous RV and LV     tracings.   ASSESSMENT AND PLAN:  Madeline Hayes is a 75 year old female with shortness of  breath and dyspnea on exertion.  She has very mild pulmonary hypertension  for which medical therapy will be recommended and other causes of dyspnea  investigated.                                               Veneda Melter, M.D.  NG/MEDQ  D:  08/19/2003  T:  08/19/2003  Job:  [id number]   cc:   Ramon Dredge L. Juanetta Gosling, M.D.  [address]  [place]  [place] [address]  Fax: [phone]   Learta Codding, M.D.

## 2011-05-05 ENCOUNTER — Encounter: Payer: Self-pay | Admitting: *Deleted

## 2011-05-05 ENCOUNTER — Encounter: Payer: Self-pay | Admitting: Cardiology

## 2011-05-06 ENCOUNTER — Encounter: Payer: Self-pay | Admitting: Cardiology

## 2011-05-06 ENCOUNTER — Ambulatory Visit (INDEPENDENT_AMBULATORY_CARE_PROVIDER_SITE_OTHER): Payer: PRIVATE HEALTH INSURANCE | Admitting: Cardiology

## 2011-05-06 DIAGNOSIS — R079 Chest pain, unspecified: Secondary | ICD-10-CM

## 2011-05-06 DIAGNOSIS — R911 Solitary pulmonary nodule: Secondary | ICD-10-CM

## 2011-05-06 DIAGNOSIS — R0602 Shortness of breath: Secondary | ICD-10-CM

## 2011-05-06 DIAGNOSIS — J984 Other disorders of lung: Secondary | ICD-10-CM

## 2011-05-06 DIAGNOSIS — J449 Chronic obstructive pulmonary disease, unspecified: Secondary | ICD-10-CM | POA: Insufficient documentation

## 2011-05-06 NOTE — Assessment & Plan Note (Signed)
Followed by Dr. Bird. 

## 2011-05-06 NOTE — Patient Instructions (Signed)
Your physician recommends that you have a Dobutamine Myoview, please see instruction sheet given to you today.  Your physician recommends that you continue on your current medications as directed. Please refer to the Current Medication list given to you today.  Your physician recommends that you schedule a follow-up appointment in: 1 month

## 2011-05-06 NOTE — Assessment & Plan Note (Signed)
Intermittent in description with both typical and atypical features, although with history of no significance CAD by catheterization in 2010. She does have active cardiac risk factors. Followup ECG is nonspecific with single PAC no acute ST segment changes. Plan is to follow up with a dobutamine Myoview for  reassessment of ischemia. If low risk, would suggest continued risk factor modification and followup with Pulmonary Medicine. Otherwise we can see her back and discuss additional evaluation.

## 2011-05-06 NOTE — Progress Notes (Signed)
Clinical Summary Madeline Hayes is a 75 y.o.female referred back to the office by Dr. Egbert Garibaldi for evaluation of chest pain and shortness of breath. Her history includes previous cardiac catheterization in 2010 demonstrating no significant CAD, no mitral regurgitation, PA pressure of 43, normal cardiac output, and mean capillary wedge pressure of 17. She has been followed from a pulmonary perspective by Dr. Egbert Garibaldi in the meantime.  Madeline Hayes describes somewhat progressive shortness of breath over the last several months, NYHA class III, intermittent chest pain described as a tightness. She also states that she had a recent cold within the last several weeks, and states that she never seemed to bounce back. She was seen at the [facility] just recently on the 16th.   She states that she is trying to wean off of oxygen. She reports compliance with CPAP therapy, and symptomatically feels better with this treatment.   Allergies  Allergen Reactions  . Penicillins   . Pulmicort   . Sulfur     Medication list reviewed.  Past Medical History  Diagnosis Date  . COPD (chronic obstructive pulmonary disease)   . Essential hypertension, benign   . Mixed hyperlipidemia   . Depression   . Pulmonary nodule     Followed by Dr. Egbert Garibaldi  . Alzheimer's dementia     Reportedly mild    Past Surgical History  Procedure Date  . Cholecystectomy   . Appendectomy   . Rotator cuff repair   . Bilateral oophorectomy     Family History  Problem Relation Age of Onset  . Heart failure Mother   . Hypertension    . Diabetes    . Cancer Father     Leukemia    Social History Madeline Hayes reports that she has quit smoking. Her smoking use included Cigarettes. She has never used smokeless tobacco. Madeline Hayes reports that she does not drink alcohol.  Review of Systems No palpitations or syncope. No orthopnea or PND. Otherwise negative except as outlined.  Physical Examination Filed Vitals:   05/06/11 1416    BP: 153/75  Pulse: 77  Resp: 18   Somewhat chronically ill-appearing woman in no acute distress. HEENT: Conjunctiva and lids normal, oropharynx with moist mucosa. Neck: Supple, no elevated JVP or carotid bruits, no thyromegaly. Lungs: Diminished breath sounds throughout, no wheezing. Cardiac: Regular rate and rhythm, soft systolic murmur without radiation, no diastolic murmur or gallop. Abdomen: Soft, nontender, bowel cells present. Skin: Warm and dry. Muscular skeletal: No kyphosis. Extremities: No frank pitting edema, distal pulses one plus. Neuropsychiatric alert and oriented x3, affect appropriate.  ECG Sinus rhythm at 74 with single PAC.   Problem List and Plan

## 2011-05-06 NOTE — Assessment & Plan Note (Signed)
I suspect this is multifactorial, associated with her pulmonary status and also likely an element of diastolic dysfunction.

## 2011-05-16 ENCOUNTER — Other Ambulatory Visit: Payer: Self-pay | Admitting: Cardiology

## 2011-05-17 ENCOUNTER — Encounter (HOSPITAL_COMMUNITY)
Admission: RE | Admit: 2011-05-17 | Discharge: 2011-05-17 | Disposition: A | Discharge status: Home / Self Care | Payer: No Typology Code available for payment source | Source: Ambulatory Visit | Attending: Cardiology | Admitting: Cardiology

## 2011-05-17 ENCOUNTER — Encounter (HOSPITAL_COMMUNITY): Payer: Self-pay | Admitting: Cardiology

## 2011-05-17 ENCOUNTER — Ambulatory Visit (INDEPENDENT_AMBULATORY_CARE_PROVIDER_SITE_OTHER): Payer: No Typology Code available for payment source | Admitting: *Deleted

## 2011-05-17 DIAGNOSIS — J4489 Other specified chronic obstructive pulmonary disease: Secondary | ICD-10-CM | POA: Insufficient documentation

## 2011-05-17 DIAGNOSIS — R079 Chest pain, unspecified: Secondary | ICD-10-CM

## 2011-05-17 DIAGNOSIS — E785 Hyperlipidemia, unspecified: Secondary | ICD-10-CM | POA: Insufficient documentation

## 2011-05-17 DIAGNOSIS — I10 Essential (primary) hypertension: Secondary | ICD-10-CM | POA: Insufficient documentation

## 2011-05-17 DIAGNOSIS — R0602 Shortness of breath: Secondary | ICD-10-CM | POA: Insufficient documentation

## 2011-05-17 DIAGNOSIS — J449 Chronic obstructive pulmonary disease, unspecified: Secondary | ICD-10-CM | POA: Insufficient documentation

## 2011-05-17 MED ORDER — TECHNETIUM TC 99M TETROFOSMIN IV KIT
10.0000 | PACK | Freq: Once | INTRAVENOUS | Status: AC | PRN
  Administered 2011-05-17: 10.3 via INTRAVENOUS

## 2011-05-17 MED ORDER — TECHNETIUM TC 99M TETROFOSMIN IV KIT
30.0000 | PACK | Freq: Once | INTRAVENOUS | Status: AC | PRN
  Administered 2011-05-17: 30 via INTRAVENOUS

## 2011-05-17 NOTE — Progress Notes (Signed)
Stress Lab Nurses Notes - [name]  Madeline YZAGUIRRE 05/17/2011  Reason for doing test: Chest Pain and Dyspnea  Type of test: Dobutamine Myoview  Nurse performing test: Parke Poisson, RN  Nuclear Medicine Tech: Lyndel Pleasure  Echo Tech: Not Applicable  MD performing test: Ival Bible & Joni Reining, NP  Family MD: Egbert Garibaldi  Test explained and consent signed: yes  IV started: 22g jelco, Saline lock flushed, No redness or edema and Saline lock started in radiology  Symptoms: nervous  Treatment/Intervention: None  Reason test stopped: protocol completed and reached target HR  After recovery IV was: Discontinued via X-ray tech and No redness or edema  Patient to return to Nuc. Med at :13:15  Patient discharged: Home  Patient's Condition upon discharge was: stable  Comments: During test BP 178/68 & HR 141.  Increase in HR196 after dobutamine off.  SVT.  Recovery BP 142/62  HR 96.  Symptoms resolved in recovery.  Erskine Speed T

## 2011-05-31 ENCOUNTER — Encounter: Payer: Self-pay | Admitting: Cardiology

## 2011-06-06 ENCOUNTER — Ambulatory Visit (INDEPENDENT_AMBULATORY_CARE_PROVIDER_SITE_OTHER): Payer: No Typology Code available for payment source | Admitting: Cardiology

## 2011-06-06 ENCOUNTER — Encounter: Payer: Self-pay | Admitting: Cardiology

## 2011-06-06 DIAGNOSIS — R0602 Shortness of breath: Secondary | ICD-10-CM

## 2011-06-06 DIAGNOSIS — I498 Other specified cardiac arrhythmias: Secondary | ICD-10-CM

## 2011-06-06 DIAGNOSIS — I471 Supraventricular tachycardia: Secondary | ICD-10-CM

## 2011-06-06 MED ORDER — VERAPAMIL HCL 180 MG PO TBCR: 180.0000 mg | EXTENDED_RELEASE_TABLET | Freq: Every day | ORAL | Status: DC

## 2011-06-06 NOTE — Progress Notes (Signed)
Clinical Summary Madeline Hayes is a 75 y.o.female presenting for followup. She was seen recently in October. She was referred for followup ischemic testing via Myoview which demonstrated no evidence of ischemia with normal LVEF. She did develop SVT during dobutamine infusion, although it was not entirely clear whether this was a recurring problem at baseline, versus simply being related to the dobutamine.  We discussed the test results today. Dyspnea has not changed, and does not appear to be ischemic. I asked her about sudden onset palpitations and she did endorse experiencing these on occasion.   Allergies  Allergen Reactions  . Penicillins   . Pulmicort   . Sulfur     Medication list reviewed.  Past Medical History  Diagnosis Date  . COPD (chronic obstructive pulmonary disease)   . Essential hypertension, benign   . Mixed hyperlipidemia   . Depression   . Pulmonary nodule     Followed by Dr. Egbert Garibaldi  . Alzheimer's dementia     Reportedly mild    Past Surgical History  Procedure Date  . Cholecystectomy   . Appendectomy   . Rotator cuff repair   . Bilateral oophorectomy     Family History  Problem Relation Age of Onset  . Heart failure Mother   . Hypertension    . Diabetes    . Cancer Father     Leukemia    Social History Ms. Fassnacht reports that she has quit smoking. Her smoking use included Cigarettes. She has never used smokeless tobacco. Ms. Metzner reports that she does not drink alcohol.  Review of Systems Occasional headaches. No syncope. Appetite stable. Otherwise negative.  Physical Examination Filed Vitals:   06/06/11 1404  BP: 142/69  Pulse: 88  Resp: 18   Somewhat chronically ill-appearing woman in no acute distress.  HEENT: Conjunctiva and lids normal, oropharynx with moist mucosa.  Neck: Supple, no elevated JVP or carotid bruits, no thyromegaly.  Lungs: Diminished breath sounds throughout, no wheezing.  Cardiac: Regular rate and rhythm, soft systolic  murmur without radiation, no diastolic murmur or gallop.  Abdomen: Soft, nontender, bowel cells present.  Extremities: No frank pitting edema, distal pulses one plus.    Problem List and Plan

## 2011-06-06 NOTE — Assessment & Plan Note (Signed)
Not entirely clear if this was simply noted in the setting of Dobutamine or perhaps paroxysmal at baseline. She does endorse occasional palpitations. Will increase Verapamil SR to 180 mg daily. Followup arranged.

## 2011-06-06 NOTE — Assessment & Plan Note (Signed)
Perhaps multifactorial, does not look to be ischemic in etiology based on followup testing. Continue followup with Pulmonary.

## 2011-06-06 NOTE — Patient Instructions (Signed)
Your physician has recommended you make the following change in your medication: increase Verapamil to 180 mg daily  Your physician recommends that you schedule a follow-up appointment in: 6 months

## 2011-12-09 ENCOUNTER — Encounter: Payer: Self-pay | Admitting: Cardiology

## 2011-12-09 ENCOUNTER — Ambulatory Visit (INDEPENDENT_AMBULATORY_CARE_PROVIDER_SITE_OTHER): Payer: No Typology Code available for payment source | Admitting: Cardiology

## 2011-12-09 VITALS — BP 132/76 | HR 73 | Resp 18 | Ht 66.0 in | Wt 196.0 lb

## 2011-12-09 DIAGNOSIS — J4489 Other specified chronic obstructive pulmonary disease: Secondary | ICD-10-CM

## 2011-12-09 DIAGNOSIS — I498 Other specified cardiac arrhythmias: Secondary | ICD-10-CM

## 2011-12-09 DIAGNOSIS — I471 Supraventricular tachycardia: Secondary | ICD-10-CM

## 2011-12-09 DIAGNOSIS — R079 Chest pain, unspecified: Secondary | ICD-10-CM

## 2011-12-09 DIAGNOSIS — J449 Chronic obstructive pulmonary disease, unspecified: Secondary | ICD-10-CM

## 2011-12-09 NOTE — Patient Instructions (Signed)
**Note De-identified  Obfuscation** Your physician recommends that you continue on your current medications as directed. Please refer to the Current Medication list given to you today.  Your physician recommends that you schedule a follow-up appointment in: 6 months  

## 2011-12-09 NOTE — Assessment & Plan Note (Signed)
Continue regular pulmonary followup and management.

## 2011-12-09 NOTE — Assessment & Plan Note (Signed)
Atypical, not ischemic in etiology based on reassuring cardiac testing over time.

## 2011-12-09 NOTE — Progress Notes (Signed)
Clinical Summary Ms. Mondesir is a 76 y.o.female presenting for followup. She was seen in October 2012. At that time she had a followup stress test in the form of a dobutamine Myoview that showed no evidence of ischemia. She did have SVT during the study, not certain if this was simply related to dobutamine, or whether she has had this intermittently in relation to some of her prior chest pain symptoms.  She reports occasional episodes of fleeting chest pain, quite atypical in description. Generally fewer palpitations. She is now using oxygen around the clock with CPAP at night.   Allergies  Allergen Reactions  . Budesonide   . Penicillins   . Sulfur     Current Outpatient Prescriptions  Medication Sig Dispense Refill  . alendronate (FOSAMAX) 70 MG tablet Take 70 mg by mouth every 7 (seven) days. Take with a full glass of water on an empty stomach.       . Arformoterol Tartrate (BROVANA IN) Inhale into the lungs.        . Ascorbic Acid (VITAMIN C PO) Take by mouth.        Marland Kitchen aspirin 81 MG tablet Take 81 mg by mouth daily.        . Calcium Carbonate-Vit D-Min (CALTRATE PLUS PO) Take by mouth daily.        . cetirizine (ZYRTEC) 10 MG tablet Take 10 mg by mouth daily.        . citalopram (CELEXA) 20 MG tablet Take 20 mg by mouth daily.        . IPRATROPIUM BROMIDE IN Inhale into the lungs as directed.        . Levalbuterol Tartrate (XOPENEX HFA IN) Inhale into the lungs as directed.        . meloxicam (MOBIC) 7.5 MG tablet Take 7.5 mg by mouth daily.        . Omega-3 Fatty Acids (FISH OIL PO) Take by mouth.        . ranitidine (ZANTAC) 150 MG capsule Take 150 mg by mouth 2 (two) times daily.        Marland Kitchen rOPINIRole (REQUIP) 1 MG tablet Take 1 mg by mouth 3 (three) times daily.        . simvastatin (ZOCOR) 20 MG tablet Take 20 mg by mouth at bedtime.        . verapamil (CALAN-SR) 180 MG CR tablet Take 1 tablet (180 mg total) by mouth at bedtime.  30 tablet  6  . VITAMIN E PO Take by mouth.           Past Medical History  Diagnosis Date  . COPD (chronic obstructive pulmonary disease)   . Essential hypertension, benign   . Mixed hyperlipidemia   . Depression   . Pulmonary nodule     Followed by Dr. Egbert Garibaldi  . Alzheimer's dementia     Reportedly mild    Social History Ms. Chirico reports that she has quit smoking. Her smoking use included Cigarettes. She has never used smokeless tobacco. Ms. Cea reports that she does not drink alcohol.  Review of Systems Stable appetite. No falls. No reported bleeding problems. Chronically short of breath at NYHA class III. Otherwise negative.  Physical Examination Filed Vitals:   12/09/11 1423  BP: 132/76  Pulse: 73  Resp: 18    Somewhat chronically ill-appearing woman in no acute distress.  HEENT: Conjunctiva and lids normal, oropharynx with moist mucosa.  Neck: Supple, no elevated JVP or carotid bruits, no  thyromegaly.  Lungs: Diminished breath sounds throughout, no wheezing.  Cardiac: Regular rate and rhythm, soft systolic murmur without radiation, no diastolic murmur or gallop.  Abdomen: Soft, nontender, bowel cells present.  Skin: Warm and dry.  Musculoskeletal: No kyphosis.  Extremities: No frank pitting edema, distal pulses one plus.    ECG Normal sinus rhythm.  Problem List and Plan   SVT (supraventricular tachycardia) Incidentally noted during dobutamine Myoview, although does have occasional palpitations and may have this at baseline. Seems to be reasonably well-controlled on verapamil. Refill provided.  CHEST PAIN-UNSPECIFIED Atypical, not ischemic in etiology based on reassuring cardiac testing over time.  COPD (chronic obstructive pulmonary disease) Continue regular pulmonary followup and management.     Jonelle Sidle, M.D., F.A.C.C.

## 2011-12-09 NOTE — Assessment & Plan Note (Signed)
Incidentally noted during dobutamine Myoview, although does have occasional palpitations and may have this at baseline. Seems to be reasonably well-controlled on verapamil. Refill provided.

## 2011-12-23 ENCOUNTER — Ambulatory Visit (INDEPENDENT_AMBULATORY_CARE_PROVIDER_SITE_OTHER): Payer: No Typology Code available for payment source | Admitting: Urology

## 2011-12-23 DIAGNOSIS — N952 Postmenopausal atrophic vaginitis: Secondary | ICD-10-CM

## 2011-12-23 DIAGNOSIS — R339 Retention of urine, unspecified: Secondary | ICD-10-CM

## 2011-12-23 DIAGNOSIS — N949 Unspecified condition associated with female genital organs and menstrual cycle: Secondary | ICD-10-CM

## 2011-12-23 DIAGNOSIS — N3946 Mixed incontinence: Secondary | ICD-10-CM

## 2012-01-05 ENCOUNTER — Telehealth: Payer: Self-pay | Admitting: Cardiology

## 2012-01-05 MED ORDER — VERAPAMIL HCL ER 180 MG PO TBCR: 180.0000 mg | EXTENDED_RELEASE_TABLET | Freq: Every day | ORAL | Status: DC

## 2012-01-05 NOTE — Telephone Encounter (Signed)
verapamil needs called in to [facility] in [place]

## 2012-01-05 NOTE — Telephone Encounter (Signed)
rx sent into pharmacy, pt notified. 

## 2012-03-16 ENCOUNTER — Ambulatory Visit (INDEPENDENT_AMBULATORY_CARE_PROVIDER_SITE_OTHER): Payer: No Typology Code available for payment source | Admitting: Urology

## 2012-03-16 DIAGNOSIS — N318 Other neuromuscular dysfunction of bladder: Secondary | ICD-10-CM

## 2012-03-16 DIAGNOSIS — N3946 Mixed incontinence: Secondary | ICD-10-CM

## 2012-03-16 DIAGNOSIS — R339 Retention of urine, unspecified: Secondary | ICD-10-CM

## 2012-04-27 ENCOUNTER — Ambulatory Visit (INDEPENDENT_AMBULATORY_CARE_PROVIDER_SITE_OTHER): Payer: No Typology Code available for payment source | Admitting: Urology

## 2012-04-27 DIAGNOSIS — N3946 Mixed incontinence: Secondary | ICD-10-CM

## 2012-07-02 ENCOUNTER — Ambulatory Visit (HOSPITAL_COMMUNITY)
Admission: RE | Admit: 2012-07-02 | Discharge: 2012-07-02 | Disposition: A | Discharge status: Home / Self Care | Payer: No Typology Code available for payment source | Source: Ambulatory Visit | Attending: Cardiology | Admitting: Cardiology

## 2012-07-02 DIAGNOSIS — J4489 Other specified chronic obstructive pulmonary disease: Secondary | ICD-10-CM | POA: Insufficient documentation

## 2012-07-02 DIAGNOSIS — R0602 Shortness of breath: Secondary | ICD-10-CM

## 2012-07-02 DIAGNOSIS — J449 Chronic obstructive pulmonary disease, unspecified: Secondary | ICD-10-CM | POA: Insufficient documentation

## 2012-07-02 DIAGNOSIS — R0609 Other forms of dyspnea: Secondary | ICD-10-CM | POA: Insufficient documentation

## 2012-07-02 DIAGNOSIS — I059 Rheumatic mitral valve disease, unspecified: Secondary | ICD-10-CM | POA: Insufficient documentation

## 2012-07-02 DIAGNOSIS — R0989 Other specified symptoms and signs involving the circulatory and respiratory systems: Secondary | ICD-10-CM | POA: Insufficient documentation

## 2012-07-02 DIAGNOSIS — I369 Nonrheumatic tricuspid valve disorder, unspecified: Secondary | ICD-10-CM | POA: Insufficient documentation

## 2012-07-02 NOTE — Progress Notes (Signed)
*  PRELIMINARY RESULTS* Echocardiogram 2D Echocardiogram has been performed.  Madeline Hayes 07/02/2012, 1:32 PM

## 2012-07-04 ENCOUNTER — Encounter: Payer: Self-pay | Admitting: *Deleted

## 2012-07-09 ENCOUNTER — Ambulatory Visit: Payer: No Typology Code available for payment source | Admitting: Adult Health

## 2012-07-17 ENCOUNTER — Ambulatory Visit (INDEPENDENT_AMBULATORY_CARE_PROVIDER_SITE_OTHER): Payer: No Typology Code available for payment source | Admitting: Adult Health

## 2012-07-17 ENCOUNTER — Encounter: Payer: Self-pay | Admitting: Adult Health

## 2012-07-17 VITALS — BP 154/90 | HR 71 | Ht 65.0 in | Wt 196.0 lb

## 2012-07-17 DIAGNOSIS — I471 Supraventricular tachycardia: Secondary | ICD-10-CM

## 2012-07-17 DIAGNOSIS — J449 Chronic obstructive pulmonary disease, unspecified: Secondary | ICD-10-CM

## 2012-07-17 DIAGNOSIS — I498 Other specified cardiac arrhythmias: Secondary | ICD-10-CM

## 2012-07-17 MED ORDER — ATORVASTATIN CALCIUM 20 MG PO TABS: 20.0000 mg | ORAL_TABLET | Freq: Every day | ORAL | Status: DC

## 2012-07-17 NOTE — Progress Notes (Deleted)
. Name: Madeline Hayes    DOB: 09/21/1935  Age: 76 y.o.  MR#: [id number]       PCP:  Ernestine Conrad, MD      Insurance: @PAYORNAME @   CC:   No chief complaint on file.   VS BP 154/90  Pulse 71  Ht 5\' 5"  (1.651 m)  Wt 196 lb (88.905 kg)  BMI 32.62 kg/m2  Weights Current Weight  07/17/12 196 lb (88.905 kg)  12/09/11 196 lb (88.905 kg)  06/06/11 199 lb (90.266 kg)    Blood Pressure  BP Readings from Last 3 Encounters:  07/17/12 154/90  12/09/11 132/76  06/06/11 142/69     Admit date:  (Not on file) Last encounter with RMR:  Visit date not found   Allergy Allergies  Allergen Reactions  . Budesonide   . Penicillins   . Sulfur     Current Outpatient Prescriptions  Medication Sig Dispense Refill  . alendronate (FOSAMAX) 70 MG tablet Take 70 mg by mouth every 7 (seven) days. Take with a full glass of water on an empty stomach.       . Arformoterol Tartrate (BROVANA IN) Inhale into the lungs.        . Ascorbic Acid (VITAMIN C PO) Take by mouth.        Marland Kitchen aspirin 81 MG tablet Take 81 mg by mouth daily.        . Calcium Carbonate-Vit D-Min (CALTRATE PLUS PO) Take by mouth daily.        . cetirizine (ZYRTEC) 10 MG tablet Take 10 mg by mouth daily.        . citalopram (CELEXA) 20 MG tablet Take 20 mg by mouth daily.        . IPRATROPIUM BROMIDE IN Inhale into the lungs as directed.        . Levalbuterol Tartrate (XOPENEX HFA IN) Inhale into the lungs as directed.        . meloxicam (MOBIC) 7.5 MG tablet Take 7.5 mg by mouth daily.        . Omega-3 Fatty Acids (FISH OIL PO) Take by mouth.        . ranitidine (ZANTAC) 150 MG capsule Take 150 mg by mouth 2 (two) times daily.        Marland Kitchen rOPINIRole (REQUIP) 1 MG tablet Take 1 mg by mouth 3 (three) times daily.        . simvastatin (ZOCOR) 20 MG tablet Take 20 mg by mouth at bedtime.        . verapamil (CALAN-SR) 180 MG CR tablet Take 1 tablet (180 mg total) by mouth at bedtime.  30 tablet  6  . VITAMIN E PO Take by mouth.           Discontinued Meds:   There are no discontinued medications.  Patient Active Problem List  Diagnosis  . DYSPNEA  . CHEST PAIN-UNSPECIFIED  . COPD (chronic obstructive pulmonary disease)  . Pulmonary nodule  . SVT (supraventricular tachycardia)    LABS No visits with results within 3 Month(s) from this visit. Latest known visit with results is:  Admission on 11/21/2008, Discharged on 11/21/2008  Component Date Value  . pH, Arterial 11/21/2008 7.393   . pCO2 arterial 11/21/2008 40.6   . pO2, Arterial 11/21/2008 69.0*  . Bicarbonate 11/21/2008 24.8*  . TCO2 11/21/2008 26   . O2 Saturation 11/21/2008 93.0   . Sample type 11/21/2008 ARTERIAL   . pH, Ven 11/21/2008 7.323*  . pCO2,  Ven 11/21/2008 45.7   . pO2, Ven 11/21/2008 35.0   . Bicarbonate 11/21/2008 23.7   . TCO2 11/21/2008 25   . O2 Saturation 11/21/2008 62.0   . Acid-base deficit 11/21/2008 3.0*  . Sample type 11/21/2008 VENOUS   . Comment 11/21/2008 VALUES VERIFIED BY LAB OR ISTAT      Results for this Opt Visit:     Results for orders placed during the hospital encounter of 11/21/08  POCT I-STAT 3, BLOOD GAS (G3+)      Component Value Range   pH, Arterial 7.393  7.350 - 7.400   pCO2 arterial 40.6  35.0 - 45.0 mmHg   pO2, Arterial 69.0 (*) 80.0 - 100.0 mmHg   Bicarbonate 24.8 (*) 20.0 - 24.0 mEq/L   TCO2 26  0 - 100 mmol/L   O2 Saturation 93.0     Sample type ARTERIAL    POCT I-STAT 3, BLOOD GAS (G3P V)      Component Value Range   pH, Ven 7.323 (*) 7.250 - 7.300   pCO2, Ven 45.7  45.0 - 50.0 mmHg   pO2, Ven 35.0  30.0 - 45.0 mmHg   Bicarbonate 23.7  20.0 - 24.0 mEq/L   TCO2 25  0 - 100 mmol/L   O2 Saturation 62.0     Acid-base deficit 3.0 (*) 0.0 - 2.0 mmol/L   Sample type VENOUS     Comment VALUES VERIFIED BY LAB OR ISTAT      EKG Orders placed in visit on 07/17/12  . EKG 12-LEAD     Prior Assessment and Plan Problem List as of 07/17/2012          DYSPNEA   Last Assessment & Plan Note    06/06/2011 Office Visit Signed 06/06/2011  2:25 PM by Jonelle Sidle, MD    Perhaps multifactorial, does not look to be ischemic in etiology based on followup testing. Continue followup with Pulmonary.    CHEST PAIN-UNSPECIFIED   Last Assessment & Plan Note   12/09/2011 Office Visit Signed 12/09/2011  2:53 PM by Jonelle Sidle, MD    Atypical, not ischemic in etiology based on reassuring cardiac testing over time.    COPD (chronic obstructive pulmonary disease)   Last Assessment & Plan Note   12/09/2011 Office Visit Signed 12/09/2011  2:54 PM by Jonelle Sidle, MD    Continue regular pulmonary followup and management.    Pulmonary nodule   Last Assessment & Plan Note   05/06/2011 Office Visit Signed 05/06/2011  3:18 PM by Jonelle Sidle, MD    Followed by Dr. Egbert Garibaldi.    SVT (supraventricular tachycardia)   Last Assessment & Plan Note   12/09/2011 Office Visit Signed 12/09/2011  2:53 PM by Jonelle Sidle, MD    Incidentally noted during dobutamine Myoview, although does have occasional palpitations and may have this at baseline. Seems to be reasonably well-controlled on verapamil. Refill provided.        Imaging: No results found.   FRS Calculation: Score not calculated. Missing: Total Cholesterol, HDL

## 2012-07-17 NOTE — Assessment & Plan Note (Signed)
She remains on O2 and neb treatments per pulmonologist, Dr. Egbert Garibaldi. Requesting CT scan results for more information. Will speak with Dr. Diona Browner about apparent phone call between him and Dr.Bird to ascertain what, if any, cardiology recommendations are requested. I have reviewed the echocardiogram completed on 07/02/2012. She was found to have normal EF of  55%-60% Wall motion was normal. No LVH. Mildly increased PA pressure of 50 mmHg.

## 2012-07-17 NOTE — Assessment & Plan Note (Addendum)
No recurrence of SVT or complaints of palpitations at this time. Continue verapamil at current dose. However, she is also on simvastatin. I will change this to atorvastatin 20 mg daily.

## 2012-07-17 NOTE — Progress Notes (Signed)
.   HPI: Madeline Hayes is a 76 y/o patient of Dr. Diona Browner that we are seeing on follow up with history of SVT. She has been on verapamil for rate control and without further complaint. She comes today to discuss results of echocardiogram which was ordered by Dr. Diona Browner. She also states that her pulmonologist, Dr.Fleck in [place], ordered a CT scan of the chest secondary to abnormal CXR. The CT scan was apparently abnormal for "lung nodules." Records are being requested. She states that Dr.Teater spoke with Dr. Diona Browner about the abnormal CT scan and requested cardiology follow up. The patient's BP has been a little more elevated in light of the new findings on CT scan. She denies recurrent heart racing. Remains medically complaint, but is quite anxious about her abnormal CT.   Allergies  Allergen Reactions  . Budesonide   . Penicillins   . Sulfur     Current Outpatient Prescriptions  Medication Sig Dispense Refill  . alendronate (FOSAMAX) 70 MG tablet Take 70 mg by mouth every 7 (seven) days. Take with a full glass of water on an empty stomach.       . Arformoterol Tartrate (BROVANA IN) Inhale into the lungs.        . Ascorbic Acid (VITAMIN C PO) Take by mouth.        Marland Kitchen aspirin 81 MG tablet Take 81 mg by mouth daily.        . Calcium Carbonate-Vit D-Min (CALTRATE PLUS PO) Take by mouth daily.        . cetirizine (ZYRTEC) 10 MG tablet Take 10 mg by mouth daily.        . citalopram (CELEXA) 20 MG tablet Take 20 mg by mouth daily.        . IPRATROPIUM BROMIDE IN Inhale into the lungs as directed.        . Levalbuterol Tartrate (XOPENEX HFA IN) Inhale into the lungs as directed.        . meloxicam (MOBIC) 7.5 MG tablet Take 7.5 mg by mouth daily.        . metoprolol succinate (TOPROL-XL) 25 MG 24 hr tablet Take 25 mg by mouth daily.      . Omega-3 Fatty Acids (FISH OIL PO) Take by mouth.        . ranitidine (ZANTAC) 150 MG capsule Take 150 mg by mouth 2 (two) times daily.        Marland Kitchen rOPINIRole  (REQUIP) 1 MG tablet Take 1 mg by mouth 3 (three) times daily.        . simvastatin (ZOCOR) 20 MG tablet Take 20 mg by mouth at bedtime.        . verapamil (CALAN-SR) 180 MG CR tablet Take 1 tablet (180 mg total) by mouth at bedtime.  30 tablet  6  . VITAMIN E PO Take by mouth.          Past Medical History  Diagnosis Date  . COPD (chronic obstructive pulmonary disease)   . Essential hypertension, benign   . Mixed hyperlipidemia   . Depression   . Pulmonary nodule     Followed by Dr. Egbert Garibaldi  . Alzheimer's dementia     Reportedly mild    Past Surgical History  Procedure Date  . Cholecystectomy   . Appendectomy   . Rotator cuff repair   . Bilateral oophorectomy     VHQ:IONGEX of systems complete and found to be negative unless listed above  PHYSICAL EXAM BP 154/90  Pulse 71  Ht 5\' 5"  (1.651 m)  Wt 196 lb (88.905 kg)  BMI 32.62 kg/m2  General: Well developed, well nourished, in no acute distress, wearing portable O2 via [place]. Head: Eyes PERRLA, No xanthomas.   Normal cephalic and atramatic  Lungs: Clear bilaterally to auscultation and percussion, but diminished in the bases. Heart: HRRR S1 S2, without MRG.  Pulses are 2+ & equal.            No carotid bruit. No JVD.  No abdominal bruits. No femoral bruits. Abdomen: Bowel sounds are positive, abdomen soft and non-tender without masses or                  Hernia's noted. Msk:  Back normal, normal gait. Normal strength and tone for age. Extremities: Positive for clubbing,  But negative for cyanosis or edema.  DP +1 Neuro: Alert and oriented X 3. Psych:  Good affect, responds appropriately  EKG: NSR rate of 71 bpm.  ASSESSMENT AND PLAN

## 2012-07-17 NOTE — Patient Instructions (Addendum)
Your physician recommends that you schedule a follow-up appointment in: ONE MONTH WITH SM  Your physician has recommended you make the following change in your medication:   1) STOP SIMVASTATIN 2) START ATORVASTATIN 20MG  DAILY

## 2012-07-17 NOTE — Progress Notes (Deleted)
Name: Madeline Hayes    DOB: [birth year]  Age: 76 y.o.  MR#: [id number]       PCP:  Ernestine Conrad, MD      Insurance: @PAYORNAME @   CC:   No chief complaint on file.   VS BP 154/90  Pulse 71  Ht 5\' 5"  (1.651 m)  Wt 196 lb (88.905 kg)  BMI 32.62 kg/m2  Weights Current Weight  07/17/12 196 lb (88.905 kg)  12/09/11 196 lb (88.905 kg)  06/06/11 199 lb (90.266 kg)    Blood Pressure  BP Readings from Last 3 Encounters:  07/17/12 154/90  12/09/11 132/76  06/06/11 142/69     Admit date:  (Not on file) Last encounter with RMR:  Visit date not found   Allergy Allergies  Allergen Reactions  . Budesonide   . Penicillins   . Sulfur     Current Outpatient Prescriptions  Medication Sig Dispense Refill  . alendronate (FOSAMAX) 70 MG tablet Take 70 mg by mouth every 7 (seven) days. Take with a full glass of water on an empty stomach.       . Arformoterol Tartrate (BROVANA IN) Inhale into the lungs.        . Ascorbic Acid (VITAMIN C PO) Take by mouth.        Marland Kitchen aspirin 81 MG tablet Take 81 mg by mouth daily.        . Calcium Carbonate-Vit D-Min (CALTRATE PLUS PO) Take by mouth daily.        . cetirizine (ZYRTEC) 10 MG tablet Take 10 mg by mouth daily.        . citalopram (CELEXA) 20 MG tablet Take 20 mg by mouth daily.        . IPRATROPIUM BROMIDE IN Inhale into the lungs as directed.        . Levalbuterol Tartrate (XOPENEX HFA IN) Inhale into the lungs as directed.        . meloxicam (MOBIC) 7.5 MG tablet Take 7.5 mg by mouth daily.        . metoprolol succinate (TOPROL-XL) 25 MG 24 hr tablet Take 25 mg by mouth daily.      . Omega-3 Fatty Acids (FISH OIL PO) Take by mouth.        . ranitidine (ZANTAC) 150 MG capsule Take 150 mg by mouth 2 (two) times daily.        Marland Kitchen rOPINIRole (REQUIP) 1 MG tablet Take 1 mg by mouth 3 (three) times daily.        . simvastatin (ZOCOR) 20 MG tablet Take 20 mg by mouth at bedtime.        . verapamil (CALAN-SR) 180 MG CR tablet Take 1 tablet (180 mg total)  by mouth at bedtime.  30 tablet  6  . VITAMIN E PO Take by mouth.          Discontinued Meds:   There are no discontinued medications.  Patient Active Problem List  Diagnosis  . DYSPNEA  . CHEST PAIN-UNSPECIFIED  . COPD (chronic obstructive pulmonary disease)  . Pulmonary nodule  . SVT (supraventricular tachycardia)    LABS No visits with results within 3 Month(s) from this visit. Latest known visit with results is:  Admission on 11/21/2008, Discharged on 11/21/2008  Component Date Value  . pH, Arterial 11/21/2008 7.393   . pCO2 arterial 11/21/2008 40.6   . pO2, Arterial 11/21/2008 69.0*  . Bicarbonate 11/21/2008 24.8*  . TCO2 11/21/2008 26   . O2 Saturation  11/21/2008 93.0   . Sample type 11/21/2008 ARTERIAL   . pH, Ven 11/21/2008 7.323*  . pCO2, Ven 11/21/2008 45.7   . pO2, Ven 11/21/2008 35.0   . Bicarbonate 11/21/2008 23.7   . TCO2 11/21/2008 25   . O2 Saturation 11/21/2008 62.0   . Acid-base deficit 11/21/2008 3.0*  . Sample type 11/21/2008 VENOUS   . Comment 11/21/2008 VALUES VERIFIED BY LAB OR ISTAT      Results for this Opt Visit:     Results for orders placed during the hospital encounter of 11/21/08  POCT I-STAT 3, BLOOD GAS (G3+)      Component Value Range   pH, Arterial 7.393  7.350 - 7.400   pCO2 arterial 40.6  35.0 - 45.0 mmHg   pO2, Arterial 69.0 (*) 80.0 - 100.0 mmHg   Bicarbonate 24.8 (*) 20.0 - 24.0 mEq/L   TCO2 26  0 - 100 mmol/L   O2 Saturation 93.0     Sample type ARTERIAL    POCT I-STAT 3, BLOOD GAS (G3P V)      Component Value Range   pH, Ven 7.323 (*) 7.250 - 7.300   pCO2, Ven 45.7  45.0 - 50.0 mmHg   pO2, Ven 35.0  30.0 - 45.0 mmHg   Bicarbonate 23.7  20.0 - 24.0 mEq/L   TCO2 25  0 - 100 mmol/L   O2 Saturation 62.0     Acid-base deficit 3.0 (*) 0.0 - 2.0 mmol/L   Sample type VENOUS     Comment VALUES VERIFIED BY LAB OR ISTAT      EKG Orders placed in visit on 07/17/12  . EKG 12-LEAD     Prior Assessment and Plan Problem  List as of 07/17/2012          DYSPNEA   Last Assessment & Plan Note   06/06/2011 Office Visit Signed 06/06/2011  2:25 PM by Jonelle Sidle, MD    Perhaps multifactorial, does not look to be ischemic in etiology based on followup testing. Continue followup with Pulmonary.    CHEST PAIN-UNSPECIFIED   Last Assessment & Plan Note   12/09/2011 Office Visit Signed 12/09/2011  2:53 PM by Jonelle Sidle, MD    Atypical, not ischemic in etiology based on reassuring cardiac testing over time.    COPD (chronic obstructive pulmonary disease)   Last Assessment & Plan Note   12/09/2011 Office Visit Signed 12/09/2011  2:54 PM by Jonelle Sidle, MD    Continue regular pulmonary followup and management.    Pulmonary nodule   Last Assessment & Plan Note   05/06/2011 Office Visit Signed 05/06/2011  3:18 PM by Jonelle Sidle, MD    Followed by Dr. Egbert Garibaldi.    SVT (supraventricular tachycardia)   Last Assessment & Plan Note   12/09/2011 Office Visit Signed 12/09/2011  2:53 PM by Jonelle Sidle, MD    Incidentally noted during dobutamine Myoview, although does have occasional palpitations and may have this at baseline. Seems to be reasonably well-controlled on verapamil. Refill provided.        Imaging: No results found.   FRS Calculation: Score not calculated. Missing: Total Cholesterol, HDL

## 2012-08-17 ENCOUNTER — Encounter: Payer: Self-pay | Admitting: Cardiology

## 2012-08-17 ENCOUNTER — Ambulatory Visit (INDEPENDENT_AMBULATORY_CARE_PROVIDER_SITE_OTHER): Payer: No Typology Code available for payment source | Admitting: Cardiology

## 2012-08-17 VITALS — BP 150/70 | HR 74 | Ht 66.0 in | Wt 196.1 lb

## 2012-08-17 DIAGNOSIS — I471 Supraventricular tachycardia: Secondary | ICD-10-CM

## 2012-08-17 DIAGNOSIS — I10 Essential (primary) hypertension: Secondary | ICD-10-CM

## 2012-08-17 DIAGNOSIS — I498 Other specified cardiac arrhythmias: Secondary | ICD-10-CM

## 2012-08-17 DIAGNOSIS — R911 Solitary pulmonary nodule: Secondary | ICD-10-CM

## 2012-08-17 MED ORDER — VERAPAMIL HCL ER 240 MG PO TBCR: 240.0000 mg | EXTENDED_RELEASE_TABLET | Freq: Every day | ORAL | Status: DC

## 2012-08-17 NOTE — Assessment & Plan Note (Signed)
For now we will increase Verapamil SR to 240 mg a day. Office followup arranged. Might consider at that point taking her off metoprolol and trying an ACE inhibitor or ARB if she needs additional blood pressure control.

## 2012-08-17 NOTE — Assessment & Plan Note (Signed)
Quiescent at this point. 

## 2012-08-17 NOTE — Progress Notes (Signed)
Clinical Summary Madeline Hayes is a 77 y.o.female presenting for followup. She was seen recently by Ms. Lawrence NP in December 2013, I reviewed her note. There was some discussion at that time about a followup chest CT done by the patient's pulmonologist Dr. Egbert Hayes in [place] for evaluation of pulmonary nodules. Most recent chest CT noted is from August 2013 at [facility] showing no pulmonary embolus, emphysematous changes, right middle lobe nodule of 6 mm, stable mild nodularity at the left lung base. This is being followed by Dr. Egbert Hayes. Patient is on chronic oxygen.  Echocardiogram performed in December 2013 revealed normal LV chamber size and wall thickness with LVEF 55-60%, no regional wall motion abnormalities, increased PA pressure reported at 50 mm mercury. She reports stable and myogenic last 2-3 dyspnea.  Her main complaint concerns increasing blood pressures. She reports systolics in the 170s to 180s at home. Her primary care physician added Toprol-XL one month ago. She has previously been on higher dose verapamil.  Allergies  Allergen Reactions  . Budesonide   . Penicillins   . Sulfur     Current Outpatient Prescriptions  Medication Sig Dispense Refill  . alendronate (FOSAMAX) 70 MG tablet Take 70 mg by mouth every 7 (seven) days. Take with a full glass of water on an empty stomach.       . Arformoterol Tartrate (BROVANA IN) Inhale into the lungs.        . Ascorbic Acid (VITAMIN C PO) Take by mouth.        Marland Kitchen aspirin 81 MG tablet Take 81 mg by mouth daily.        Marland Kitchen atorvastatin (LIPITOR) 20 MG tablet Take 1 tablet (20 mg total) by mouth daily.  30 tablet  3  . Calcium Carbonate-Vit D-Min (CALTRATE PLUS PO) Take by mouth daily.        . cetirizine (ZYRTEC) 10 MG tablet Take 10 mg by mouth daily.        . citalopram (CELEXA) 20 MG tablet Take 20 mg by mouth daily.        . IPRATROPIUM BROMIDE IN Inhale into the lungs as directed.        . Levalbuterol Tartrate (XOPENEX HFA IN) Inhale  into the lungs as directed.        . meloxicam (MOBIC) 7.5 MG tablet Take 7.5 mg by mouth daily.        . metoprolol succinate (TOPROL-XL) 25 MG 24 hr tablet Take 25 mg by mouth daily.      . Omega-3 Fatty Acids (FISH OIL PO) Take by mouth.        . ranitidine (ZANTAC) 150 MG capsule Take 150 mg by mouth 2 (two) times daily.        Marland Kitchen rOPINIRole (REQUIP) 1 MG tablet Take 1 mg by mouth 3 (three) times daily.        Marland Kitchen VITAMIN E PO Take by mouth.        . verapamil (CALAN-SR) 240 MG CR tablet Take 1 tablet (240 mg total) by mouth at bedtime.  90 tablet  1    Past Medical History  Diagnosis Date  . COPD (chronic obstructive pulmonary disease)   . Essential hypertension, benign   . Mixed hyperlipidemia   . Depression   . Pulmonary nodule     Followed by Dr. Egbert Hayes  . Alzheimer's dementia     Reportedly mild    Social History Madeline Hayes reports that she has quit smoking. Her smoking  use included Cigarettes. She has never used smokeless tobacco. Madeline Hayes reports that she does not drink alcohol.  Review of Systems No palpitations or chest pain. No bleeding episodes. No syncope.  Physical Examination Filed Vitals:   08/17/12 1445  BP: 150/70  Pulse: 74   Filed Weights   08/17/12 1445  Weight: 196 lb 1.3 oz (88.941 kg)   No acute distress. Wearing oxygen. HEENT: Conjunctiva and lids normal, oropharynx with moist mucosa.  Neck: Supple, no elevated JVP or carotid bruits, no thyromegaly.  Lungs: Diminished breath sounds throughout, no wheezing.  Cardiac: Regular rate and rhythm, soft systolic murmur without radiation, no diastolic murmur or gallop.  Abdomen: Soft, nontender, bowel cells present.  Skin: Warm and dry.  Musculoskeletal: No kyphosis.  Extremities: No frank pitting edema, distal pulses one plus.    Problem List and Plan   Essential hypertension, benign For now we will increase Verapamil SR to 240 mg a day. Office followup arranged. Might consider at that point taking  her off metoprolol and trying an ACE inhibitor or ARB if she needs additional blood pressure control.  SVT (supraventricular tachycardia) Quiescent at this point.  Pulmonary nodule Noted by chest CT August 2013 at [facility], followed by Dr. Egbert Hayes in [place].    Madeline Hayes, M.D., F.A.C.C.

## 2012-08-17 NOTE — Assessment & Plan Note (Signed)
Noted by chest CT August 2013 at [facility], followed by Dr. Egbert Garibaldi in [place].

## 2012-08-17 NOTE — Patient Instructions (Addendum)
Your physician recommends that you schedule a follow-up appointment in: 3 weeks with SM  Your physician has recommended you make the following change in your medication:   1) INCREASE YOUR VERAPAMIL TO 240MG  ONE TABLET DAILY

## 2012-09-24 ENCOUNTER — Encounter: Payer: Self-pay | Admitting: Cardiology

## 2012-09-24 ENCOUNTER — Ambulatory Visit (INDEPENDENT_AMBULATORY_CARE_PROVIDER_SITE_OTHER): Payer: No Typology Code available for payment source | Admitting: Cardiology

## 2012-09-24 VITALS — BP 121/67 | HR 77 | Ht 65.0 in | Wt 198.2 lb

## 2012-09-24 DIAGNOSIS — I498 Other specified cardiac arrhythmias: Secondary | ICD-10-CM

## 2012-09-24 DIAGNOSIS — I471 Supraventricular tachycardia: Secondary | ICD-10-CM

## 2012-09-24 DIAGNOSIS — I10 Essential (primary) hypertension: Secondary | ICD-10-CM

## 2012-09-24 NOTE — Assessment & Plan Note (Signed)
No palpitations on CCB.

## 2012-09-24 NOTE — Patient Instructions (Addendum)
Your physician recommends that you schedule a follow-up appointment in: 3 MONTHS  Your physician recommends that you return for lab work in: THIS WEEK ( SLIPS GIVEN) BMET

## 2012-09-24 NOTE — Assessment & Plan Note (Signed)
Continue current dose of Verapamil and stay off metoprolol. Will check BMET and then decide about another agent - possibly ACEI or ARB. Followup arranged.

## 2012-09-24 NOTE — Progress Notes (Signed)
Clinical Summary Ms. Campanella is a 77 y.o.female presenting for followup. She was last seen in January. Verapamil SR was increased at the last visit. She is now off metoprolol. Blood pressure trend is better. She reports systolics in the 140s mostly, this AM was 170.  Echocardiogram performed in December 2013 revealed normal LV chamber size and wall thickness with LVEF 55-60%, no regional wall motion abnormalities, increased PA pressure reported at 50 mm mercury.   Allergies  Allergen Reactions  . Budesonide   . Penicillins   . Sulfur     Current Outpatient Prescriptions  Medication Sig Dispense Refill  . alendronate (FOSAMAX) 70 MG tablet Take 70 mg by mouth every 7 (seven) days. Take with a full glass of water on an empty stomach.       . Arformoterol Tartrate (BROVANA IN) Inhale into the lungs.        . Ascorbic Acid (VITAMIN C PO) Take by mouth.        Marland Kitchen aspirin 81 MG tablet Take 81 mg by mouth daily.        Marland Kitchen atorvastatin (LIPITOR) 20 MG tablet Take 1 tablet (20 mg total) by mouth daily.  30 tablet  3  . Calcium Carbonate-Vit D-Min (CALTRATE PLUS PO) Take by mouth daily.        . cetirizine (ZYRTEC) 10 MG tablet Take 10 mg by mouth daily.        . citalopram (CELEXA) 20 MG tablet Take 20 mg by mouth daily.        . IPRATROPIUM BROMIDE IN Inhale into the lungs as directed.        . Levalbuterol Tartrate (XOPENEX HFA IN) Inhale into the lungs as directed.        . meloxicam (MOBIC) 7.5 MG tablet Take 7.5 mg by mouth daily.        . Omega-3 Fatty Acids (FISH OIL PO) Take by mouth.        . ranitidine (ZANTAC) 150 MG capsule Take 150 mg by mouth 2 (two) times daily.        Marland Kitchen rOPINIRole (REQUIP) 1 MG tablet Take 1 mg by mouth 3 (three) times daily.        . verapamil (CALAN-SR) 240 MG CR tablet Take 1 tablet (240 mg total) by mouth at bedtime.  90 tablet  1  . VITAMIN E PO Take by mouth.        . metoprolol succinate (TOPROL-XL) 25 MG 24 hr tablet Take 25 mg by mouth daily.       No  current facility-administered medications for this visit.    Past Medical History  Diagnosis Date  . COPD (chronic obstructive pulmonary disease)   . Essential hypertension, benign   . Mixed hyperlipidemia   . Depression   . Pulmonary nodule     Followed by Dr. Egbert Garibaldi  . Alzheimer's dementia     Reportedly mild    Social History Ms. Catalina reports that she has quit smoking. Her smoking use included Cigarettes. She smoked 0.00 packs per day. She has never used smokeless tobacco. Ms. Whittle reports that she does not drink alcohol.  Review of Systems Negative except as outlined.  Physical Examination Filed Vitals:   09/24/12 1446  BP: 121/67  Pulse: 77   Filed Weights   09/24/12 1446  Weight: 198 lb 4 oz (89.926 kg)    No acute distress. Wearing oxygen.  HEENT: Conjunctiva and lids normal, oropharynx with moist mucosa.  Neck:  Supple, no elevated JVP or carotid bruits, no thyromegaly.  Lungs: Diminished breath sounds throughout, no wheezing.  Cardiac: Regular rate and rhythm, soft systolic murmur without radiation, no diastolic murmur or gallop.  Abdomen: Soft, nontender, bowel cells present.  Skin: Warm and dry.  Musculoskeletal: No kyphosis.    Problem List and Plan   Essential hypertension, benign Continue current dose of Verapamil and stay off metoprolol. Will check BMET and then decide about another agent - possibly ACEI or ARB. Followup arranged.  SVT (supraventricular tachycardia) No palpitations on CCB.    Jonelle Sidle, M.D., F.A.C.C.

## 2012-09-25 LAB — BASIC METABOLIC PANEL
BUN: 25 mg/dL — ABNORMAL HIGH (ref 6–23)
Calcium: 9.8 mg/dL (ref 8.4–10.5)
Chloride: 105 mEq/L (ref 96–112)
Creat: 0.94 mg/dL (ref 0.50–1.10)

## 2012-10-02 ENCOUNTER — Encounter: Payer: Self-pay | Admitting: *Deleted

## 2012-10-04 ENCOUNTER — Encounter: Payer: Self-pay | Admitting: *Deleted

## 2012-11-09 ENCOUNTER — Ambulatory Visit (INDEPENDENT_AMBULATORY_CARE_PROVIDER_SITE_OTHER): Payer: No Typology Code available for payment source | Admitting: Urology

## 2012-11-09 DIAGNOSIS — R82998 Other abnormal findings in urine: Secondary | ICD-10-CM

## 2012-11-09 DIAGNOSIS — N3946 Mixed incontinence: Secondary | ICD-10-CM

## 2013-01-07 ENCOUNTER — Ambulatory Visit (INDEPENDENT_AMBULATORY_CARE_PROVIDER_SITE_OTHER): Payer: No Typology Code available for payment source | Admitting: Adult Health

## 2013-01-07 ENCOUNTER — Ambulatory Visit: Payer: No Typology Code available for payment source | Admitting: Cardiology

## 2013-01-07 ENCOUNTER — Encounter: Payer: Self-pay | Admitting: Adult Health

## 2013-01-07 VITALS — BP 118/72 | HR 68 | Ht 65.0 in | Wt 189.1 lb

## 2013-01-07 DIAGNOSIS — J449 Chronic obstructive pulmonary disease, unspecified: Secondary | ICD-10-CM

## 2013-01-07 DIAGNOSIS — R079 Chest pain, unspecified: Secondary | ICD-10-CM

## 2013-01-07 DIAGNOSIS — I471 Supraventricular tachycardia: Secondary | ICD-10-CM

## 2013-01-07 DIAGNOSIS — I498 Other specified cardiac arrhythmias: Secondary | ICD-10-CM

## 2013-01-07 DIAGNOSIS — I10 Essential (primary) hypertension: Secondary | ICD-10-CM

## 2013-01-07 NOTE — Assessment & Plan Note (Signed)
Remains 02 dependent with ongoing treatment per pulmonologist.

## 2013-01-07 NOTE — Assessment & Plan Note (Signed)
She has had no complaints of palpitations or heart racing. She is very pleased with her current medications as they are keeping her asymptomatic. Continue meds as directed by Dr. Gracy Racer. See her again in 6 months unless she is symptomatic.

## 2013-01-07 NOTE — Progress Notes (Signed)
HPI: Madeline Hayes is a 77 year old patient of Dr. Diona Browner where following for ongoing assessment and management of his hypertension with history of SVT. The patient was last seen in March of 2014. She remained in normal sinus rhythm without symptoms on verapamil SR. She was taken off of metoprolol.  Followup labs revealed a sodium 144 potassium 5.7 chloride 105, BUN 28 creatinine of 0.94. Blood pressure on last visit 121/67.  She comes today without cardiac complaints. Remains  O2 dependent. Chronic muscle aches and pains.   Allergies  Allergen Reactions  . Budesonide   . Penicillins   . Sulfur     Current Outpatient Prescriptions  Medication Sig Dispense Refill  . alendronate (FOSAMAX) 70 MG tablet Take 70 mg by mouth every 7 (seven) days. Take with a full glass of water on an empty stomach.       . Arformoterol Tartrate (BROVANA IN) Inhale into the lungs.        . Ascorbic Acid (VITAMIN C PO) Take by mouth.        Marland Kitchen aspirin 81 MG tablet Take 81 mg by mouth daily.        Marland Kitchen atorvastatin (LIPITOR) 20 MG tablet Take 1 tablet (20 mg total) by mouth daily.  30 tablet  3  . Calcium Carbonate-Vit D-Min (CALTRATE PLUS PO) Take by mouth daily.        . citalopram (CELEXA) 20 MG tablet Take 20 mg by mouth daily.        . IPRATROPIUM BROMIDE IN Inhale into the lungs as directed.        . Levalbuterol Tartrate (XOPENEX HFA IN) Inhale into the lungs as directed.        . metoprolol succinate (TOPROL-XL) 25 MG 24 hr tablet Take 25 mg by mouth daily.      . Omega-3 Fatty Acids (FISH OIL PO) Take by mouth.        . verapamil (CALAN-SR) 240 MG CR tablet Take 1 tablet (240 mg total) by mouth at bedtime.  90 tablet  1  . VITAMIN E PO Take by mouth.         No current facility-administered medications for this visit.    Past Medical History  Diagnosis Date  . COPD (chronic obstructive pulmonary disease)   . Essential hypertension, benign   . Mixed hyperlipidemia   . Depression   . Pulmonary  nodule     Followed by Dr. Egbert Garibaldi  . Alzheimer's dementia     Reportedly mild    Past Surgical History  Procedure Laterality Date  . Cholecystectomy    . Appendectomy    . Rotator cuff repair    . Bilateral oophorectomy      FAO:ZHYQMV of systems complete and found to be negative unless listed above  PHYSICAL EXAM BP 118/72  Pulse 68  Ht 5\' 5"  (1.651 m)  Wt 189 lb 1.4 oz (85.771 kg)  BMI 31.47 kg/m2  SpO2 97%  General: Well developed, well nourished, in no acute distress., wearing O2.  Head: Eyes PERRLA, No xanthomas.   Normal cephalic and atramatic  Lungs: Clear bilaterally to auscultation and percussion. Diminished in the bases. Heart: HRRR S1 S2, without MRG.  Pulses are 2+ & equal.            No carotid bruit. No JVD.  No abdominal bruits. No femoral bruits. Abdomen: Bowel sounds are positive, abdomen soft and non-tender without masses or  Hernia's noted. Msk:  Back normal, normal gait. Normal strength and tone for age. Extremities: No clubbing, cyanosis or edema.  DP +1 Neuro: Alert and oriented X 3. Psych:  Good affect, responds appropriately    ASSESSMENT AND PLAN

## 2013-01-07 NOTE — Patient Instructions (Addendum)
Your physician recommends that you schedule a follow-up appointment in: 6 MONTHS   Your physician recommends that you continue on your current medications as directed. Please refer to the Current Medication list given to you today.  

## 2013-01-07 NOTE — Assessment & Plan Note (Signed)
Blood pressure is currently well controlled on verapamil, metoprolol. No changes at this time.

## 2013-01-07 NOTE — Assessment & Plan Note (Signed)
She is without complaint of chest pain. No bronchospasms  on BB.

## 2013-02-14 ENCOUNTER — Telehealth: Payer: Self-pay | Admitting: *Deleted

## 2013-02-14 MED ORDER — VERAPAMIL HCL ER 240 MG PO TBCR: 240.0000 mg | EXTENDED_RELEASE_TABLET | Freq: Every day | ORAL | Status: DC

## 2013-02-14 NOTE — Telephone Encounter (Signed)
Pt needs verapamil called in to [facility] in [place]

## 2013-02-14 NOTE — Telephone Encounter (Signed)
Medication sent via escribe.  

## 2013-05-10 ENCOUNTER — Encounter (INDEPENDENT_AMBULATORY_CARE_PROVIDER_SITE_OTHER): Payer: Self-pay

## 2013-05-10 ENCOUNTER — Ambulatory Visit (INDEPENDENT_AMBULATORY_CARE_PROVIDER_SITE_OTHER): Payer: No Typology Code available for payment source | Admitting: Urology

## 2013-05-10 DIAGNOSIS — R339 Retention of urine, unspecified: Secondary | ICD-10-CM

## 2013-05-10 DIAGNOSIS — N3946 Mixed incontinence: Secondary | ICD-10-CM

## 2013-07-08 ENCOUNTER — Encounter: Payer: Self-pay | Admitting: Adult Health

## 2013-07-08 ENCOUNTER — Ambulatory Visit (INDEPENDENT_AMBULATORY_CARE_PROVIDER_SITE_OTHER): Payer: No Typology Code available for payment source | Admitting: Adult Health

## 2013-07-08 VITALS — BP 129/65 | HR 78 | Ht 66.0 in | Wt 193.0 lb

## 2013-07-08 DIAGNOSIS — Z23 Encounter for immunization: Secondary | ICD-10-CM

## 2013-07-08 DIAGNOSIS — I10 Essential (primary) hypertension: Secondary | ICD-10-CM

## 2013-07-08 DIAGNOSIS — J449 Chronic obstructive pulmonary disease, unspecified: Secondary | ICD-10-CM

## 2013-07-08 DIAGNOSIS — I498 Other specified cardiac arrhythmias: Secondary | ICD-10-CM

## 2013-07-08 DIAGNOSIS — I471 Supraventricular tachycardia: Secondary | ICD-10-CM

## 2013-07-08 NOTE — Assessment & Plan Note (Signed)
She remains on O2 and inhalers. She states that her BG has been elevated at home. I will check a Hgb A1C.

## 2013-07-08 NOTE — Patient Instructions (Addendum)
Your physician recommends that you schedule a follow-up appointment in: 6 months You will receive a reminder letter two months in advance reminding you to call and schedule your appointment. If you don't receive this letter, please contact our office.  Your physician recommends that you continue on your current medications as directed. Please refer to the Current Medication list given to you today.   Your physician recommends that you return for lab work in the next week. BMET, A1C

## 2013-07-08 NOTE — Assessment & Plan Note (Signed)
She is well controlled. I will check BMET today.

## 2013-07-08 NOTE — Progress Notes (Signed)
HPI: [name] is a 77 year old patient of Dr. Diona Hayes where following for ongoing assessment and management of hypertension, with history of SVT, COPD. The patient was last seen in June of 2014, sugar made in normal sinus rhythm without symptoms and continued verapamil. At that time the patient was without cardiac complaint she is here for six-month follow-up.      She comes today requesting a flu shot. She denies chest pain, she has chronic shortness of breath and is on O2 via [place]. She is depressed.      Allergies  Allergen Reactions  . Budesonide   . Penicillins   . Sulfur     Current Outpatient Prescriptions  Medication Sig Dispense Refill  . alendronate (FOSAMAX) 70 MG tablet Take 70 mg by mouth every 7 (seven) days. Take with a full glass of water on an empty stomach.       . Arformoterol Tartrate (BROVANA IN) Inhale into the lungs.        . Ascorbic Acid (VITAMIN C PO) Take by mouth.        Marland Kitchen aspirin 81 MG tablet Take 81 mg by mouth daily.        Marland Kitchen atorvastatin (LIPITOR) 20 MG tablet Take 1 tablet (20 mg total) by mouth daily.  30 tablet  3  . Calcium Carbonate-Vit D-Min (CALTRATE PLUS PO) Take by mouth daily.        . citalopram (CELEXA) 20 MG tablet Take 20 mg by mouth daily.        . IPRATROPIUM BROMIDE IN Inhale into the lungs as directed.        . Levalbuterol Tartrate (XOPENEX HFA IN) Inhale into the lungs as directed.        . metoprolol succinate (TOPROL-XL) 25 MG 24 hr tablet Take 25 mg by mouth daily.      . Omega-3 Fatty Acids (FISH OIL PO) Take by mouth.        . verapamil (CALAN-SR) 240 MG CR tablet Take 1 tablet (240 mg total) by mouth at bedtime.  90 tablet  1  . VITAMIN E PO Take by mouth.         No current facility-administered medications for this visit.    Past Medical History  Diagnosis Date  . COPD (chronic obstructive pulmonary disease)   . Essential hypertension, benign   . Mixed hyperlipidemia   . Depression   . Pulmonary nodule     Followed  by Dr. Egbert Hayes  . Alzheimer's dementia     Reportedly mild    Past Surgical History  Procedure Laterality Date  . Cholecystectomy    . Appendectomy    . Rotator cuff repair    . Bilateral oophorectomy      GNF:AOZHYQ of systems complete and found to be negative unless listed above PHYSICAL EXAM General: Well developed, well nourished, in no acute distress Head: Eyes PERRLA, No xanthomas.   Normal cephalic and atramatic  Lungs: Clear bilaterally,scant crackles are noted. \Heart: HRRR S1 S2, without MRG.  Pulses are 2+ & equal.            No carotid bruit. No JVD.  No abdominal bruits. No femoral bruits. Abdomen: Bowel sounds are positive, abdomen soft and non-tender without masses or                  Hernia's noted. Msk:  Back normal, normal gait. Normal strength and tone for age. Extremities: Mild  clubbing, cyanosis or edema.  DP +1 Neuro: Alert and oriented X 3. Psych:  Good affect, responds appropriately  EKG: NSR rate of 72 bpm.  ASSESSMENT AND PLAN

## 2013-07-08 NOTE — Assessment & Plan Note (Signed)
No recurrence of rapid HR. She will be continued on verapamil as directed.

## 2013-07-08 NOTE — Progress Notes (Signed)
Name: Madeline Hayes    DOB: 10/06/1935  Age: 77 y.o.  MR#: [id number]       PCP:  Ernestine Conrad, MD      Insurance: Payor: [employer] / Plan: [employer] / Product Type: *No Product type* /   CC:    Chief Complaint  Patient presents with  . Hypertension  . COPD    VS Filed Vitals:   07/08/13 1314  BP: 129/65  Pulse: 78  Height: 5\' 6"  (1.676 m)  Weight: 193 lb (87.544 kg)    Weights Current Weight  07/08/13 193 lb (87.544 kg)  01/07/13 189 lb 1.4 oz (85.771 kg)  09/24/12 198 lb 4 oz (89.926 kg)    Blood Pressure  BP Readings from Last 3 Encounters:  07/08/13 129/65  01/07/13 118/72  09/24/12 121/67     Admit date:  (Not on file) Last encounter with RMR:  Visit date not found   Allergy Budesonide; Penicillins; and Sulfur  Current Outpatient Prescriptions  Medication Sig Dispense Refill  . alendronate (FOSAMAX) 70 MG tablet Take 70 mg by mouth every 7 (seven) days. Take with a full glass of water on an empty stomach.       . Arformoterol Tartrate (BROVANA IN) Inhale into the lungs.        . Ascorbic Acid (VITAMIN C PO) Take by mouth.        Marland Kitchen aspirin 81 MG tablet Take 81 mg by mouth daily.        Marland Kitchen atorvastatin (LIPITOR) 20 MG tablet Take 1 tablet (20 mg total) by mouth daily.  30 tablet  3  . Calcium Carbonate-Vit D-Min (CALTRATE PLUS PO) Take by mouth daily.        . citalopram (CELEXA) 20 MG tablet Take 20 mg by mouth daily.        . IPRATROPIUM BROMIDE IN Inhale into the lungs as directed.        . Levalbuterol Tartrate (XOPENEX HFA IN) Inhale into the lungs as directed.        . metoprolol succinate (TOPROL-XL) 25 MG 24 hr tablet Take 25 mg by mouth daily.      . Omega-3 Fatty Acids (FISH OIL PO) Take by mouth.        . verapamil (CALAN-SR) 240 MG CR tablet Take 1 tablet (240 mg total) by mouth at bedtime.  90 tablet  1  . VITAMIN E PO Take by mouth.         No current facility-administered medications for this visit.     Discontinued Meds:   There are no discontinued medications.  Patient Active Problem List   Diagnosis Date Noted  . Essential hypertension, benign 08/17/2012  . SVT (supraventricular tachycardia) 06/06/2011  . COPD (chronic obstructive pulmonary disease) 05/06/2011  . Pulmonary nodule 05/06/2011  . DYSPNEA 04/10/2009  . CHEST PAIN-UNSPECIFIED 04/10/2009    LABS    Component Value Date/Time   NA 144 09/24/2012 1625   K 5.7* 09/24/2012 1625   CL 105 09/24/2012 1625   CO2 31 09/24/2012 1625   GLUCOSE 84 09/24/2012 1625   BUN 25* 09/24/2012 1625   CREATININE 0.94 09/24/2012 1625   CALCIUM 9.8 09/24/2012 1625   CMP     Component Value Date/Time   NA 144 09/24/2012 1625   K 5.7* 09/24/2012 1625   CL 105 09/24/2012 1625   CO2 31 09/24/2012 1625   GLUCOSE 84 09/24/2012 1625   BUN 25* 09/24/2012 1625  CREATININE 0.94 09/24/2012 1625   CALCIUM 9.8 09/24/2012 1625    No results found for this basename: wbc, hgb, hct, mcv, platelets    Lipid Panel  No results found for this basename: chol, trig, hdl, cholhdl, vldl, ldlcalc    ABG    Component Value Date/Time   PHART 7.393 11/21/2008 0840   PCO2ART 40.6 11/21/2008 0840   PO2ART 69.0* 11/21/2008 0840   HCO3 23.7 11/21/2008 0844   TCO2 25 11/21/2008 0844   ACIDBASEDEF 3.0* 11/21/2008 0844   O2SAT 62.0 11/21/2008 0844     No results found for this basename: TSH   BNP (last 3 results) No results found for this basename: PROBNP,  in the last 8760 hours Cardiac Panel (last 3 results) No results found for this basename: CKTOTAL, CKMB, TROPONINI, RELINDX,  in the last 72 hours  Iron/TIBC/Ferritin No results found for this basename: iron, tibc, ferritin     EKG Orders placed in visit on 07/08/13  . EKG 12-LEAD     Prior Assessment and Plan Problem List as of 07/08/2013     Cardiovascular and Mediastinum   SVT (supraventricular tachycardia)   Last Assessment & Plan   01/07/2013 Office Visit Written 01/07/2013  3:23 PM by Jodelle Gross, NP     She has had no complaints of palpitations or heart racing. She is very pleased with her current medications as they are keeping her asymptomatic. Continue meds as directed by Dr. Gracy Racer. See her again in 6 months unless she is symptomatic.    Essential hypertension, benign   Last Assessment & Plan   01/07/2013 Office Visit Written 01/07/2013  3:24 PM by Jodelle Gross, NP     Blood pressure is currently well controlled on verapamil, metoprolol. No changes at this time.       Respiratory   COPD (chronic obstructive pulmonary disease)   Last Assessment & Plan   01/07/2013 Office Visit Written 01/07/2013  3:25 PM by Jodelle Gross, NP     Remains [phone] dependent with ongoing treatment per pulmonologist.       Other   DYSPNEA   Last Assessment & Plan   06/06/2011 Office Visit Written 06/06/2011  2:25 PM by Jonelle Sidle, MD     Perhaps multifactorial, does not look to be ischemic in etiology based on followup testing. Continue followup with Pulmonary.    CHEST PAIN-UNSPECIFIED   Last Assessment & Plan   01/07/2013 Office Visit Written 01/07/2013  3:24 PM by Jodelle Gross, NP     She is without complaint of chest pain. No bronchospasms  on BB.     Pulmonary nodule   Last Assessment & Plan   08/17/2012 Office Visit Written 08/17/2012  3:09 PM by Jonelle Sidle, MD     Noted by chest CT August 2013 at [facility], followed by Dr. Egbert Garibaldi in [place].        Imaging: No results found.

## 2013-07-24 ENCOUNTER — Encounter: Payer: Self-pay | Admitting: *Deleted

## 2013-07-24 LAB — BASIC METABOLIC PANEL
BUN: 19 mg/dL (ref 6–23)
CO2: 28 mEq/L (ref 19–32)
Calcium: 9.6 mg/dL (ref 8.4–10.5)
Chloride: 105 mEq/L (ref 96–112)
Creat: 0.96 mg/dL (ref 0.50–1.10)
Glucose, Bld: 112 mg/dL — ABNORMAL HIGH (ref 70–99)
Potassium: 5.2 mEq/L (ref 3.5–5.3)
Sodium: 141 mEq/L (ref 135–145)

## 2013-07-24 LAB — HEMOGLOBIN A1C
Hgb A1c MFr Bld: 5.2 % (ref <5.7)
Mean Plasma Glucose: 103 mg/dL (ref <117)

## 2013-08-09 ENCOUNTER — Ambulatory Visit: Payer: No Typology Code available for payment source | Admitting: Urology

## 2013-08-14 ENCOUNTER — Telehealth: Payer: Self-pay

## 2013-08-14 MED ORDER — VERAPAMIL HCL ER 240 MG PO TBCR: 240.0000 mg | EXTENDED_RELEASE_TABLET | Freq: Every day | ORAL | Status: DC

## 2013-08-14 NOTE — Telephone Encounter (Signed)
Medication sent via escribe.  

## 2013-08-14 NOTE — Telephone Encounter (Signed)
Pt husband came in with bottle and stated needs refill  verapamil (CALAN-SR) 240 MG CR tablet RX [id number] Patient is out of this medication since yesterday.

## 2013-09-13 ENCOUNTER — Ambulatory Visit: Payer: No Typology Code available for payment source | Admitting: Urology

## 2014-01-10 ENCOUNTER — Ambulatory Visit (INDEPENDENT_AMBULATORY_CARE_PROVIDER_SITE_OTHER): Payer: Medicare PPO | Admitting: Urology

## 2014-01-10 DIAGNOSIS — N3946 Mixed incontinence: Secondary | ICD-10-CM

## 2014-01-10 DIAGNOSIS — R82998 Other abnormal findings in urine: Secondary | ICD-10-CM

## 2014-01-10 DIAGNOSIS — R1031 Right lower quadrant pain: Secondary | ICD-10-CM

## 2014-01-10 DIAGNOSIS — R339 Retention of urine, unspecified: Secondary | ICD-10-CM

## 2014-02-11 ENCOUNTER — Other Ambulatory Visit: Payer: Self-pay | Admitting: Adult Health

## 2014-07-04 ENCOUNTER — Ambulatory Visit (INDEPENDENT_AMBULATORY_CARE_PROVIDER_SITE_OTHER): Payer: Medicare PPO | Admitting: Urology

## 2014-07-04 DIAGNOSIS — N3946 Mixed incontinence: Secondary | ICD-10-CM

## 2014-07-04 DIAGNOSIS — R339 Retention of urine, unspecified: Secondary | ICD-10-CM

## 2014-07-04 DIAGNOSIS — N3281 Overactive bladder: Secondary | ICD-10-CM

## 2014-11-26 ENCOUNTER — Encounter: Payer: Self-pay | Admitting: Physician Assistant

## 2014-12-12 ENCOUNTER — Other Ambulatory Visit (INDEPENDENT_AMBULATORY_CARE_PROVIDER_SITE_OTHER): Payer: Medicare PPO

## 2014-12-12 ENCOUNTER — Ambulatory Visit (INDEPENDENT_AMBULATORY_CARE_PROVIDER_SITE_OTHER): Payer: Medicare PPO | Admitting: Physician Assistant

## 2014-12-12 ENCOUNTER — Encounter: Payer: Self-pay | Admitting: Physician Assistant

## 2014-12-12 VITALS — BP 130/64 | HR 76 | Ht 65.0 in | Wt 192.6 lb

## 2014-12-12 DIAGNOSIS — Z86010 Personal history of colonic polyps: Secondary | ICD-10-CM

## 2014-12-12 DIAGNOSIS — K5900 Constipation, unspecified: Secondary | ICD-10-CM | POA: Diagnosis not present

## 2014-12-12 LAB — CBC WITH DIFFERENTIAL/PLATELET
BASOS ABS: 0 10*3/uL (ref 0.0–0.1)
BASOS PCT: 0.7 % (ref 0.0–3.0)
EOS ABS: 0.3 10*3/uL (ref 0.0–0.7)
EOS PCT: 3.9 % (ref 0.0–5.0)
HCT: 38.4 % (ref 36.0–46.0)
HEMOGLOBIN: 12.7 g/dL (ref 12.0–15.0)
LYMPHS ABS: 1.2 10*3/uL (ref 0.7–4.0)
Lymphocytes Relative: 18.4 % (ref 12.0–46.0)
MCHC: 33.2 g/dL (ref 30.0–36.0)
MCV: 91.1 fl (ref 78.0–100.0)
MONO ABS: 0.6 10*3/uL (ref 0.1–1.0)
Monocytes Relative: 9.5 % (ref 3.0–12.0)
Neutro Abs: 4.5 10*3/uL (ref 1.4–7.7)
Neutrophils Relative %: 67.5 % (ref 43.0–77.0)
PLATELETS: 245 10*3/uL (ref 150.0–400.0)
RBC: 4.21 Mil/uL (ref 3.87–5.11)
RDW: 13.2 % (ref 11.5–15.5)
WBC: 6.6 10*3/uL (ref 4.0–10.5)

## 2014-12-12 LAB — COMPREHENSIVE METABOLIC PANEL
ALBUMIN: 4.6 g/dL (ref 3.5–5.2)
ALK PHOS: 82 U/L (ref 39–117)
ALT: 14 U/L (ref 0–35)
AST: 23 U/L (ref 0–37)
BUN: 23 mg/dL (ref 6–23)
CALCIUM: 9.6 mg/dL (ref 8.4–10.5)
CO2: 29 mEq/L (ref 19–32)
Chloride: 105 mEq/L (ref 96–112)
Creatinine, Ser: 0.99 mg/dL (ref 0.40–1.20)
GFR: 57.82 mL/min — ABNORMAL LOW (ref >60.00)
Glucose, Bld: 88 mg/dL (ref 70–99)
Potassium: 4.9 mEq/L (ref 3.5–5.1)
Sodium: 139 mEq/L (ref 135–145)
TOTAL PROTEIN: 7 g/dL (ref 6.0–8.3)
Total Bilirubin: 0.5 mg/dL (ref 0.2–1.2)

## 2014-12-12 LAB — HEPATIC FUNCTION PANEL
ALBUMIN: 4.6 g/dL (ref 3.5–5.2)
ALK PHOS: 82 U/L (ref 39–117)
ALT: 14 U/L (ref 0–35)
AST: 23 U/L (ref 0–37)
Bilirubin, Direct: 0.1 mg/dL (ref 0.0–0.3)
Total Bilirubin: 0.5 mg/dL (ref 0.2–1.2)
Total Protein: 7 g/dL (ref 6.0–8.3)

## 2014-12-12 NOTE — Progress Notes (Signed)
Patient ID: Madeline Hayes, female   DOB: 1936/02/04, 79 y.o.   MRN: [id number]    HPI:  Madeline Hayes is a 79 y.o.   female referred by Ernestine Conrad, MD  for evaluation of constipation.  Madeline Hayes states she has been constipated for most of her adult life. She skips anywhere from 4-7 days between bowel movements and then passes hard dry nugget like stools. She often strains to have a bowel movement. She does not have to push on the perineal area to help evacuate a bowel movement. She has had no bright red blood per rectum or melanotic. She does get some crampy abdominal pain prior to defecation alleviated by defecation. She has intermittent gas and bloating dependent on what she eats. She reports that she saw Dr. Joycelyn Schmid in [place] long ago and had a colonoscopy around 2000 at which time a polyp was removed. She reports that she had a follow-up colonoscopy approximately 5 years ago by Dr. Cleotis Nipper at [facility]. She is not sure if a polyp was removed. She denies a family history of colon cancer, colon polyps, or inflammatory bowel disease but states she has a personal history of ulcers long ago that were treated with medication. She has been using Mira lax one capful daily with little relief. She uses Colace up to 6 capsules at a time to produce a bowel movement. She says she tried linzess a year ago but it provided no relief after several days so she discontinued it. She does not recall ever trying an Amitiza. Her appetite has been good and her weight has been stable. Patient admits that she drinks coffee and Dr. Reino Kent but no water or fruit juice. She does not regularly eat fruits or vegetables. She tends to eat breads and fried foods. She has tried fiber supplements in the past but says they never work. Her past medical history significant for hypertension, hyperlipidemia, depression, pulmonary nodule, Alzheimer's dementia, anxiety, osteoarthritis, and COPD for which she is oxygen dependent.  Past  Medical History  Diagnosis Date  . COPD (chronic obstructive pulmonary disease)   . Essential hypertension, benign   . Mixed hyperlipidemia   . Depression   . Pulmonary nodule     Followed by Dr. Egbert Garibaldi  . Alzheimer's dementia     Reportedly mild  . Anxiety   . Arthritis   . IBS (irritable bowel syndrome)   . Sleep apnea     Uses C-Pap machine  . Bowel obstruction     Past Surgical History  Procedure Laterality Date  . Cholecystectomy  2003  . Abdominal hysterectomy  1974  . Rotator cuff repair    . Bilateral oophorectomy    . Colonoscopy     Family History  Problem Relation Age of Onset  . Heart failure Mother   . Diabetes Daughter     x 2  . Cancer Father     Leukemia  . Colon cancer Neg Hx    History  Substance Use Topics  . Smoking status: Former Smoker    Types: Cigarettes  . Smokeless tobacco: Never Used  . Alcohol Use: No   Current Outpatient Prescriptions  Medication Sig Dispense Refill  . albuterol (PROVENTIL) (2.5 MG/3ML) 0.083% nebulizer solution Take 2.5 mg by nebulization.    Marland Kitchen alendronate (FOSAMAX) 70 MG tablet Take 70 mg by mouth every 7 (seven) days. Take with a full glass of water on an empty stomach.     . Arformoterol Tartrate (BROVANA IN)  Inhale into the lungs 2 (two) times daily.     . Ascorbic Acid (VITAMIN C PO) Take by mouth.      Marland Kitchen. aspirin 81 MG tablet Take 81 mg by mouth daily.      . Calcium Carbonate-Vit D-Min (CALTRATE PLUS PO) Take by mouth daily.      . cetirizine (ZYRTEC) 10 MG tablet Take 10 mg by mouth daily.    . citalopram (CELEXA) 20 MG tablet Take 20 mg by mouth 2 (two) times daily.     Marland Kitchen. doxazosin (CARDURA) 4 MG tablet Take 4 mg by mouth daily.    . IPRATROPIUM BROMIDE IN Inhale into the lungs as directed.      . Magnesium 250 MG TABS Take 500 mg by mouth daily.    . meloxicam (MOBIC) 15 MG tablet Take 15 mg by mouth daily.    . Omega-3 Fatty Acids (FISH OIL PO) Take by mouth.      . OXYGEN Inhale into the lungs.    .  pravastatin (PRAVACHOL) 10 MG tablet Take 10 mg by mouth daily.    . ranitidine (ZANTAC) 150 MG capsule Take 150 mg by mouth daily.    . traZODone (DESYREL) 100 MG tablet Take 100 mg by mouth daily.    . verapamil (CALAN-SR) 240 MG CR tablet TAKE ONE TABLET BY MOUTH AT BEDTIME 90 tablet 3  . VITAMIN E PO Take by mouth.       No current facility-administered medications for this visit.   Allergies  Allergen Reactions  . Budesonide   . Penicillins   . Sulfur      Review of Systems: Gen: Denies any fever, chills, sweats, anorexia, fatigue, weakness, malaise, weight loss, and sleep disorder CV: Denies chest pain, angina, palpitations, syncope, orthopnea, PND, peripheral edema, and claudication. Resp: Denies dyspnea at rest, dyspnea with exercise, cough, sputum, wheezing, coughing up blood, and pleurisy. GI: Denies vomiting blood, jaundice, and fecal incontinence.   Denies dysphagia or odynophagia. GU : Denies urinary burning, blood in urine, urinary frequency, urinary hesitancy, nocturnal urination, and urinary incontinence. MS: Denies joint pain, limitation of movement, and swelling, stiffness, low back pain, extremity pain. Denies muscle weakness, cramps, atrophy.  Derm: Denies rash, itching, dry skin, hives, moles, warts, or unhealing ulcers.  Psych: Denies depression, anxiety, memory loss, suicidal ideation, hallucinations, paranoia, and confusion. Heme: Denies bruising, bleeding, and enlarged lymph nodes. Neuro:  Denies any headaches, dizziness, paresthesias. Endo:  Denies any problems with DM, thyroid, adrenal function    Physical Exam: BP 130/64 mmHg  Pulse 76  Ht 5\' 5"  (1.651 m)  Wt 192 lb 9.6 oz (87.363 kg)  BMI 32.05 kg/m2 Constitutional: Pleasant,well-developed female in no acute distress. Nasal cannula for oxygen in place HEENT: Normocephalic and atraumatic. Conjunctivae are normal. No scleral icterus. Neck supple. No cervical adenopathy Cardiovascular: Normal rate,  regular rhythm.  Pulmonary/chest: Effort normal and breath sounds normal. No wheezing, rales or rhonchi. Abdominal: Soft, nondistended, nontender. Bowel sounds active throughout. There are no masses palpable. No hepatomegaly. Rectal: Deferred per patient Extremities: no edema Lymphadenopathy: No cervical adenopathy noted. Neurological: Alert and oriented to person place and time. Skin: Skin is warm and dry. No rashes noted. Psychiatric: Normal mood and affect. Behavior is normal.  ASSESSMENT AND PLAN: #1. Constipation. Patient has been advised to use of Mira lax purge when she gets home today she will then start Mira lax 3 capfuls in water daily for a week. If this is helping her to  move her bowels she will then titrate 1-3 capfuls daily to achieve the desired responds. She has been instructed to increase water intake in her diet and to decrease her soda intake. She will add P fruits to her diet as well. A TSH, CBC, and comprehensive metabolic panel will be obtained.  #2. Personal history of colon polyps. Patient has signed a medical release so that we can obtain prior colonoscopy and pathology reports from [facility]. After review, if she is due for surveillance she will be scheduled. She will follow up in 4 weeks sooner if needed.    Collie Wernick, Tollie Pizza PA-C 12/12/2014, 11:30 AM  CC: Ernestine Conrad, MD

## 2014-12-12 NOTE — Patient Instructions (Addendum)
Lawson FiscalLori, [place]PA recommends that you complete a bowel purge (to clean out your bowels). Please do the following: Purchase a bottle of Miralax over the counter as well as a box of 5 mg dulcolax tablets. Take 4 dulcolax tablets. Wait 1 hour. You will then drink 6-8 capfuls of Miralax mixed in an adequate amount of water/juice/gatorade (you may choose which of these liquids to drink) over the next 2-3 hours. You should expect results within 1 to 6 hours after completing the bowel purge. After purge, start miralax 3 capfuls daily for 5 days, then titrate 1-3 capfuls daily as needed to achieve results.  Eat "P" fruits, peaches, pears, plums, pineapples, prunes and pear juice.  Drink 5, 16 oz bottles of water daily.  Your physician has requested that you go to the basement for lab work before leaving today.

## 2014-12-16 ENCOUNTER — Telehealth: Payer: Self-pay | Admitting: Physician Assistant

## 2014-12-16 NOTE — Progress Notes (Signed)
i agree with the above note, plan 

## 2014-12-16 NOTE — Telephone Encounter (Signed)
[facility] 12/16/14 fbg.

## 2015-01-06 ENCOUNTER — Encounter: Payer: Self-pay | Admitting: Physician Assistant

## 2015-01-06 ENCOUNTER — Ambulatory Visit (INDEPENDENT_AMBULATORY_CARE_PROVIDER_SITE_OTHER): Payer: Medicare PPO | Admitting: Physician Assistant

## 2015-01-06 VITALS — BP 120/60 | HR 86 | Ht 65.0 in | Wt 192.0 lb

## 2015-01-06 DIAGNOSIS — Z86010 Personal history of colonic polyps: Secondary | ICD-10-CM | POA: Diagnosis not present

## 2015-01-06 DIAGNOSIS — K5909 Other constipation: Secondary | ICD-10-CM | POA: Diagnosis not present

## 2015-01-06 NOTE — Patient Instructions (Signed)
Take Miralax, 17 grams in 8 oz of water 2-3 times daily.  We have sent your demographic and insurance information to [employer].  ( Phone  [phone]) They should contact you within the next week regarding your Cologuard (colon cancer screening) test. If you have not heard from them within the next week, please call our office at [phone].

## 2015-01-06 NOTE — Progress Notes (Signed)
Patient ID: Madeline Hayes, female   DOB: Mar 11, 1936, 79 y.o.   MRN: [id number]     History of Present Illness: Madeline Hayes is a delightful 79 year old female who was last seen here in late May. At that time she was complaining of constipation and reported that she had been constipated for most of her adult life. He was advised to add P fruits to her diet, and to use Mira lax. She returns today in follow-up. She has been drinking a glass appear juice on a daily basis along with a glass of cranberry peach juice and a capful of Mira lax and with this is now moving her bowels on a daily basis. She feels bloated and reports if she uses 2 capfuls of Mira lax she feels she empties better and has no bloating. She had a colonoscopy in [place] in 2000 which time a polyp was removed. She had a repeat colonoscopy in January 2010 by Dr. Cleotis Nipper at [facility]. Report of that was received and there were no colonic or rectal polyps and no diverticular disease the patient was noted to have a torturous and redundant colon. Her prep was fair to poor. She was advised to repeat Hemoccults yearly and repeat a colonoscopy in 10 years, sooner if needed. She is interested in surveillance as she had had polyps before, but she is oxygen dependent with severe COPD and is leary of having repeat colonoscopy. She has seen commercials for cologuard and is interested in colo guard testing.   Past Medical History  Diagnosis Date  . COPD (chronic obstructive pulmonary disease)   . Essential hypertension, benign   . Mixed hyperlipidemia   . Depression   . Pulmonary nodule     Followed by Dr. Egbert Garibaldi  . Alzheimer's dementia     Reportedly mild  . Anxiety   . Arthritis   . IBS (irritable bowel syndrome)   . Sleep apnea     Uses C-Pap machine  . Bowel obstruction     Past Surgical History  Procedure Laterality Date  . Cholecystectomy  2003  . Abdominal hysterectomy  1974  . Rotator cuff repair    .  Bilateral oophorectomy    . Colonoscopy     Family History  Problem Relation Age of Onset  . Heart failure Mother   . Diabetes Daughter     x 2  . Cancer Father     Leukemia  . Colon cancer Neg Hx    History  Substance Use Topics  . Smoking status: Former Smoker    Types: Cigarettes  . Smokeless tobacco: Never Used  . Alcohol Use: No   Current Outpatient Prescriptions  Medication Sig Dispense Refill  . albuterol (PROVENTIL) (2.5 MG/3ML) 0.083% nebulizer solution Take 2.5 mg by nebulization.    Marland Kitchen alendronate (FOSAMAX) 70 MG tablet Take 70 mg by mouth every 7 (seven) days. Take with a full glass of water on an empty stomach.     . Arformoterol Tartrate (BROVANA IN) Inhale into the lungs 2 (two) times daily.     . Ascorbic Acid (VITAMIN C PO) Take by mouth.      Marland Kitchen aspirin 81 MG tablet Take 81 mg by mouth daily.      . Calcium Carbonate-Vit D-Min (CALTRATE PLUS PO) Take by mouth daily.      . cetirizine (ZYRTEC) 10 MG tablet Take 10 mg by mouth daily.    . citalopram (CELEXA) 20 MG tablet Take 20 mg by  mouth 2 (two) times daily.     Marland Kitchen doxazosin (CARDURA) 4 MG tablet Take 4 mg by mouth daily.    . IPRATROPIUM BROMIDE IN Inhale into the lungs as directed.      . Magnesium 250 MG TABS Take 500 mg by mouth daily.    . meloxicam (MOBIC) 15 MG tablet Take 15 mg by mouth daily.    . Omega-3 Fatty Acids (FISH OIL PO) Take by mouth.      . OXYGEN Inhale into the lungs.    . polyethylene glycol (MIRALAX / GLYCOLAX) packet Take 17 g by mouth daily.    . pravastatin (PRAVACHOL) 10 MG tablet Take 10 mg by mouth daily.    . ranitidine (ZANTAC) 150 MG capsule Take 150 mg by mouth daily.    . traZODone (DESYREL) 100 MG tablet Take 100 mg by mouth daily.    . verapamil (CALAN-SR) 240 MG CR tablet TAKE ONE TABLET BY MOUTH AT BEDTIME 90 tablet 3  . VITAMIN E PO Take by mouth.       No current facility-administered medications for this visit.   Allergies  Allergen Reactions  . Budesonide   .  Penicillins   . Sulfur       Review of Systems: Per history of present illness otherwise negative   Physical Exam: General: Pleasant, elderly, white, oxygen dependent female in no acute distress Head: Normocephalic and atraumatic Eyes:  sclerae anicteric, conjunctiva pink  Ears: Normal auditory acuity Lungs: Clear throughout to auscultation Heart: Regular rate and rhythm Abdomen: Soft, non distended, non-tender. No masses, no hepatomegaly. Normal bowel sounds Musculoskeletal: Symmetrical with no gross deformities  Extremities: No edema  Neurological: Alert oriented x 4, grossly nonfocal Psychological:  Alert and cooperative. Normal mood and affect  Assessment and Recommendations: #1. Personal history of colon polyps. Patient is a poor candidate for repeat colonoscopy. We have reviewed Colo guard testing and the patient is interested in completing this test. Patient was advised that if the test is positive she may needa repeat colonoscopy, but if it is negative no further testing for colorectal cancer will be needed.  #2. Constipation. Patient has had significant improvement with the addition of pear or peach juice and Mira lax. She's been advised to continue this regimen and she's been advised to titrate her Mira lax anywhere from 1-3 capfuls daily to achieve the desired effect.  She will follow up as needed.      Janesa Dockery, Moise Boring 01/06/2015,

## 2015-01-07 NOTE — Progress Notes (Signed)
i agree with the above note, plan 

## 2015-02-09 ENCOUNTER — Other Ambulatory Visit: Payer: Self-pay | Admitting: Adult Health

## 2015-03-20 ENCOUNTER — Ambulatory Visit (INDEPENDENT_AMBULATORY_CARE_PROVIDER_SITE_OTHER): Payer: Medicare PPO | Admitting: Urology

## 2015-03-20 DIAGNOSIS — N3281 Overactive bladder: Secondary | ICD-10-CM

## 2015-03-20 DIAGNOSIS — R339 Retention of urine, unspecified: Secondary | ICD-10-CM

## 2015-03-20 DIAGNOSIS — N3946 Mixed incontinence: Secondary | ICD-10-CM

## 2015-06-19 ENCOUNTER — Ambulatory Visit (INDEPENDENT_AMBULATORY_CARE_PROVIDER_SITE_OTHER): Payer: Medicare PPO | Admitting: Urology

## 2015-06-19 DIAGNOSIS — N3281 Overactive bladder: Secondary | ICD-10-CM

## 2015-06-19 DIAGNOSIS — R339 Retention of urine, unspecified: Secondary | ICD-10-CM | POA: Diagnosis not present

## 2015-06-19 DIAGNOSIS — N3946 Mixed incontinence: Secondary | ICD-10-CM | POA: Diagnosis not present

## 2015-09-18 ENCOUNTER — Ambulatory Visit (INDEPENDENT_AMBULATORY_CARE_PROVIDER_SITE_OTHER): Payer: Medicare PPO | Admitting: Urology

## 2015-09-18 DIAGNOSIS — N3946 Mixed incontinence: Secondary | ICD-10-CM | POA: Diagnosis not present

## 2015-09-18 DIAGNOSIS — N3281 Overactive bladder: Secondary | ICD-10-CM

## 2015-09-18 DIAGNOSIS — R339 Retention of urine, unspecified: Secondary | ICD-10-CM | POA: Diagnosis not present

## 2016-03-25 ENCOUNTER — Other Ambulatory Visit (HOSPITAL_COMMUNITY)
Admission: RE | Admit: 2016-03-25 | Discharge: 2016-03-25 | Disposition: A | Discharge status: Home / Self Care | Payer: Medicare PPO | Source: Other Acute Inpatient Hospital | Attending: Urology | Admitting: Urology

## 2016-03-25 ENCOUNTER — Ambulatory Visit (INDEPENDENT_AMBULATORY_CARE_PROVIDER_SITE_OTHER): Payer: Medicare PPO | Admitting: Urology

## 2016-03-25 DIAGNOSIS — N3946 Mixed incontinence: Secondary | ICD-10-CM | POA: Diagnosis not present

## 2016-03-25 DIAGNOSIS — N390 Urinary tract infection, site not specified: Secondary | ICD-10-CM | POA: Insufficient documentation

## 2016-03-25 DIAGNOSIS — R3915 Urgency of urination: Secondary | ICD-10-CM | POA: Diagnosis not present

## 2016-03-25 LAB — URINE MICROSCOPIC-ADD ON: RBC / HPF: NONE SEEN RBC/hpf (ref 0–5)

## 2016-03-25 LAB — URINALYSIS, ROUTINE W REFLEX MICROSCOPIC
Bilirubin Urine: NEGATIVE
Glucose, UA: NEGATIVE mg/dL
HGB URINE DIPSTICK: NEGATIVE
Nitrite: NEGATIVE
Protein, ur: NEGATIVE mg/dL
Specific Gravity, Urine: 1.02 (ref 1.005–1.030)
pH: 5.5 (ref 5.0–8.0)

## 2016-03-28 LAB — URINE CULTURE: Culture: >=100000 — AB

## 2016-11-25 ENCOUNTER — Ambulatory Visit (INDEPENDENT_AMBULATORY_CARE_PROVIDER_SITE_OTHER): Payer: Medicare PPO | Admitting: Urology

## 2016-11-25 DIAGNOSIS — N393 Stress incontinence (female) (male): Secondary | ICD-10-CM | POA: Diagnosis not present

## 2016-12-30 ENCOUNTER — Ambulatory Visit (INDEPENDENT_AMBULATORY_CARE_PROVIDER_SITE_OTHER): Payer: Medicare PPO | Admitting: Urology

## 2016-12-30 DIAGNOSIS — R3914 Feeling of incomplete bladder emptying: Secondary | ICD-10-CM | POA: Diagnosis not present

## 2016-12-30 DIAGNOSIS — Z87440 Personal history of urinary (tract) infections: Secondary | ICD-10-CM

## 2016-12-30 DIAGNOSIS — N3946 Mixed incontinence: Secondary | ICD-10-CM | POA: Diagnosis not present

## 2017-02-03 ENCOUNTER — Ambulatory Visit (INDEPENDENT_AMBULATORY_CARE_PROVIDER_SITE_OTHER): Payer: Medicare PPO | Admitting: Urology

## 2017-02-03 DIAGNOSIS — R3914 Feeling of incomplete bladder emptying: Secondary | ICD-10-CM

## 2017-02-03 DIAGNOSIS — N3946 Mixed incontinence: Secondary | ICD-10-CM | POA: Diagnosis not present

## 2017-12-18 DIAGNOSIS — J80 Acute respiratory distress syndrome: Secondary | ICD-10-CM | POA: Diagnosis not present

## 2017-12-18 DIAGNOSIS — J449 Chronic obstructive pulmonary disease, unspecified: Secondary | ICD-10-CM | POA: Diagnosis not present

## 2017-12-20 DIAGNOSIS — F0390 Unspecified dementia without behavioral disturbance: Secondary | ICD-10-CM | POA: Diagnosis not present

## 2017-12-20 DIAGNOSIS — G4733 Obstructive sleep apnea (adult) (pediatric): Secondary | ICD-10-CM | POA: Diagnosis not present

## 2017-12-20 DIAGNOSIS — E785 Hyperlipidemia, unspecified: Secondary | ICD-10-CM | POA: Diagnosis not present

## 2017-12-20 DIAGNOSIS — J449 Chronic obstructive pulmonary disease, unspecified: Secondary | ICD-10-CM | POA: Diagnosis not present

## 2017-12-20 DIAGNOSIS — F330 Major depressive disorder, recurrent, mild: Secondary | ICD-10-CM | POA: Diagnosis not present

## 2017-12-20 DIAGNOSIS — M810 Age-related osteoporosis without current pathological fracture: Secondary | ICD-10-CM | POA: Diagnosis not present

## 2017-12-20 DIAGNOSIS — G43909 Migraine, unspecified, not intractable, without status migrainosus: Secondary | ICD-10-CM | POA: Diagnosis not present

## 2017-12-20 DIAGNOSIS — E559 Vitamin D deficiency, unspecified: Secondary | ICD-10-CM | POA: Diagnosis not present

## 2017-12-20 DIAGNOSIS — J80 Acute respiratory distress syndrome: Secondary | ICD-10-CM | POA: Diagnosis not present

## 2017-12-20 DIAGNOSIS — N3281 Overactive bladder: Secondary | ICD-10-CM | POA: Diagnosis not present

## 2018-01-05 ENCOUNTER — Ambulatory Visit: Payer: Medicare PPO | Admitting: Urology

## 2018-01-05 DIAGNOSIS — R3914 Feeling of incomplete bladder emptying: Secondary | ICD-10-CM | POA: Diagnosis not present

## 2018-01-05 DIAGNOSIS — J449 Chronic obstructive pulmonary disease, unspecified: Secondary | ICD-10-CM | POA: Diagnosis not present

## 2018-01-05 DIAGNOSIS — N3946 Mixed incontinence: Secondary | ICD-10-CM | POA: Diagnosis not present

## 2018-01-17 DIAGNOSIS — J80 Acute respiratory distress syndrome: Secondary | ICD-10-CM | POA: Diagnosis not present

## 2018-01-17 DIAGNOSIS — J449 Chronic obstructive pulmonary disease, unspecified: Secondary | ICD-10-CM | POA: Diagnosis not present

## 2018-01-19 DIAGNOSIS — J449 Chronic obstructive pulmonary disease, unspecified: Secondary | ICD-10-CM | POA: Diagnosis not present

## 2018-01-19 DIAGNOSIS — J80 Acute respiratory distress syndrome: Secondary | ICD-10-CM | POA: Diagnosis not present

## 2018-01-23 DIAGNOSIS — Z299 Encounter for prophylactic measures, unspecified: Secondary | ICD-10-CM | POA: Diagnosis not present

## 2018-01-23 DIAGNOSIS — G309 Alzheimer's disease, unspecified: Secondary | ICD-10-CM | POA: Diagnosis not present

## 2018-01-23 DIAGNOSIS — F0280 Dementia in other diseases classified elsewhere without behavioral disturbance: Secondary | ICD-10-CM | POA: Diagnosis not present

## 2018-01-23 DIAGNOSIS — Z6828 Body mass index (BMI) 28.0-28.9, adult: Secondary | ICD-10-CM | POA: Diagnosis not present

## 2018-01-23 DIAGNOSIS — J449 Chronic obstructive pulmonary disease, unspecified: Secondary | ICD-10-CM | POA: Diagnosis not present

## 2018-01-25 DIAGNOSIS — J449 Chronic obstructive pulmonary disease, unspecified: Secondary | ICD-10-CM | POA: Diagnosis not present

## 2018-02-05 DIAGNOSIS — J449 Chronic obstructive pulmonary disease, unspecified: Secondary | ICD-10-CM | POA: Diagnosis not present

## 2018-02-07 DIAGNOSIS — G4733 Obstructive sleep apnea (adult) (pediatric): Secondary | ICD-10-CM | POA: Diagnosis not present

## 2018-02-16 DIAGNOSIS — J449 Chronic obstructive pulmonary disease, unspecified: Secondary | ICD-10-CM | POA: Diagnosis not present

## 2018-02-17 DIAGNOSIS — J449 Chronic obstructive pulmonary disease, unspecified: Secondary | ICD-10-CM | POA: Diagnosis not present

## 2018-02-17 DIAGNOSIS — J80 Acute respiratory distress syndrome: Secondary | ICD-10-CM | POA: Diagnosis not present

## 2018-02-19 DIAGNOSIS — J449 Chronic obstructive pulmonary disease, unspecified: Secondary | ICD-10-CM | POA: Diagnosis not present

## 2018-02-19 DIAGNOSIS — J80 Acute respiratory distress syndrome: Secondary | ICD-10-CM | POA: Diagnosis not present

## 2018-03-20 DIAGNOSIS — J80 Acute respiratory distress syndrome: Secondary | ICD-10-CM | POA: Diagnosis not present

## 2018-03-20 DIAGNOSIS — J449 Chronic obstructive pulmonary disease, unspecified: Secondary | ICD-10-CM | POA: Diagnosis not present

## 2018-03-22 DIAGNOSIS — J80 Acute respiratory distress syndrome: Secondary | ICD-10-CM | POA: Diagnosis not present

## 2018-03-22 DIAGNOSIS — J449 Chronic obstructive pulmonary disease, unspecified: Secondary | ICD-10-CM | POA: Diagnosis not present

## 2018-04-12 DIAGNOSIS — J449 Chronic obstructive pulmonary disease, unspecified: Secondary | ICD-10-CM | POA: Diagnosis not present

## 2018-04-19 DIAGNOSIS — J449 Chronic obstructive pulmonary disease, unspecified: Secondary | ICD-10-CM | POA: Diagnosis not present

## 2018-04-19 DIAGNOSIS — J80 Acute respiratory distress syndrome: Secondary | ICD-10-CM | POA: Diagnosis not present

## 2018-04-21 DIAGNOSIS — J449 Chronic obstructive pulmonary disease, unspecified: Secondary | ICD-10-CM | POA: Diagnosis not present

## 2018-04-21 DIAGNOSIS — J80 Acute respiratory distress syndrome: Secondary | ICD-10-CM | POA: Diagnosis not present

## 2018-05-10 DIAGNOSIS — J449 Chronic obstructive pulmonary disease, unspecified: Secondary | ICD-10-CM | POA: Diagnosis not present

## 2018-05-20 DIAGNOSIS — J80 Acute respiratory distress syndrome: Secondary | ICD-10-CM | POA: Diagnosis not present

## 2018-05-20 DIAGNOSIS — J449 Chronic obstructive pulmonary disease, unspecified: Secondary | ICD-10-CM | POA: Diagnosis not present

## 2018-05-22 DIAGNOSIS — J449 Chronic obstructive pulmonary disease, unspecified: Secondary | ICD-10-CM | POA: Diagnosis not present

## 2018-05-22 DIAGNOSIS — J80 Acute respiratory distress syndrome: Secondary | ICD-10-CM | POA: Diagnosis not present

## 2018-06-07 DIAGNOSIS — R5383 Other fatigue: Secondary | ICD-10-CM | POA: Diagnosis not present

## 2018-06-07 DIAGNOSIS — E559 Vitamin D deficiency, unspecified: Secondary | ICD-10-CM | POA: Diagnosis not present

## 2018-06-07 DIAGNOSIS — J449 Chronic obstructive pulmonary disease, unspecified: Secondary | ICD-10-CM | POA: Diagnosis not present

## 2018-06-07 DIAGNOSIS — Z1331 Encounter for screening for depression: Secondary | ICD-10-CM | POA: Diagnosis not present

## 2018-06-07 DIAGNOSIS — Z0000 Encounter for general adult medical examination without abnormal findings: Secondary | ICD-10-CM | POA: Diagnosis not present

## 2018-06-07 DIAGNOSIS — Z1211 Encounter for screening for malignant neoplasm of colon: Secondary | ICD-10-CM | POA: Diagnosis not present

## 2018-06-07 DIAGNOSIS — Z299 Encounter for prophylactic measures, unspecified: Secondary | ICD-10-CM | POA: Diagnosis not present

## 2018-06-07 DIAGNOSIS — G309 Alzheimer's disease, unspecified: Secondary | ICD-10-CM | POA: Diagnosis not present

## 2018-06-07 DIAGNOSIS — Z79899 Other long term (current) drug therapy: Secondary | ICD-10-CM | POA: Diagnosis not present

## 2018-06-07 DIAGNOSIS — Z1339 Encounter for screening examination for other mental health and behavioral disorders: Secondary | ICD-10-CM | POA: Diagnosis not present

## 2018-06-07 DIAGNOSIS — E7800 Pure hypercholesterolemia, unspecified: Secondary | ICD-10-CM | POA: Diagnosis not present

## 2018-06-07 DIAGNOSIS — Z6827 Body mass index (BMI) 27.0-27.9, adult: Secondary | ICD-10-CM | POA: Diagnosis not present

## 2018-06-07 DIAGNOSIS — Z7189 Other specified counseling: Secondary | ICD-10-CM | POA: Diagnosis not present

## 2018-06-12 DIAGNOSIS — J449 Chronic obstructive pulmonary disease, unspecified: Secondary | ICD-10-CM | POA: Diagnosis not present

## 2018-06-12 DIAGNOSIS — G4733 Obstructive sleep apnea (adult) (pediatric): Secondary | ICD-10-CM | POA: Diagnosis not present

## 2018-06-19 DIAGNOSIS — J80 Acute respiratory distress syndrome: Secondary | ICD-10-CM | POA: Diagnosis not present

## 2018-06-19 DIAGNOSIS — J449 Chronic obstructive pulmonary disease, unspecified: Secondary | ICD-10-CM | POA: Diagnosis not present

## 2018-06-21 DIAGNOSIS — J80 Acute respiratory distress syndrome: Secondary | ICD-10-CM | POA: Diagnosis not present

## 2018-06-21 DIAGNOSIS — J449 Chronic obstructive pulmonary disease, unspecified: Secondary | ICD-10-CM | POA: Diagnosis not present

## 2018-07-06 DIAGNOSIS — J449 Chronic obstructive pulmonary disease, unspecified: Secondary | ICD-10-CM | POA: Diagnosis not present

## 2018-07-20 DIAGNOSIS — J80 Acute respiratory distress syndrome: Secondary | ICD-10-CM | POA: Diagnosis not present

## 2018-07-20 DIAGNOSIS — J449 Chronic obstructive pulmonary disease, unspecified: Secondary | ICD-10-CM | POA: Diagnosis not present

## 2018-07-22 DIAGNOSIS — J449 Chronic obstructive pulmonary disease, unspecified: Secondary | ICD-10-CM | POA: Diagnosis not present

## 2018-07-22 DIAGNOSIS — J80 Acute respiratory distress syndrome: Secondary | ICD-10-CM | POA: Diagnosis not present

## 2018-08-06 DIAGNOSIS — J449 Chronic obstructive pulmonary disease, unspecified: Secondary | ICD-10-CM | POA: Diagnosis not present

## 2018-08-20 DIAGNOSIS — J449 Chronic obstructive pulmonary disease, unspecified: Secondary | ICD-10-CM | POA: Diagnosis not present

## 2018-08-20 DIAGNOSIS — J80 Acute respiratory distress syndrome: Secondary | ICD-10-CM | POA: Diagnosis not present

## 2018-08-22 DIAGNOSIS — J449 Chronic obstructive pulmonary disease, unspecified: Secondary | ICD-10-CM | POA: Diagnosis not present

## 2018-08-22 DIAGNOSIS — J80 Acute respiratory distress syndrome: Secondary | ICD-10-CM | POA: Diagnosis not present

## 2018-09-07 DIAGNOSIS — J449 Chronic obstructive pulmonary disease, unspecified: Secondary | ICD-10-CM | POA: Diagnosis not present

## 2018-09-10 DIAGNOSIS — G4733 Obstructive sleep apnea (adult) (pediatric): Secondary | ICD-10-CM | POA: Diagnosis not present

## 2018-09-18 DIAGNOSIS — J80 Acute respiratory distress syndrome: Secondary | ICD-10-CM | POA: Diagnosis not present

## 2018-09-18 DIAGNOSIS — J449 Chronic obstructive pulmonary disease, unspecified: Secondary | ICD-10-CM | POA: Diagnosis not present

## 2018-09-20 DIAGNOSIS — J80 Acute respiratory distress syndrome: Secondary | ICD-10-CM | POA: Diagnosis not present

## 2018-09-20 DIAGNOSIS — J449 Chronic obstructive pulmonary disease, unspecified: Secondary | ICD-10-CM | POA: Diagnosis not present

## 2018-10-02 DIAGNOSIS — J449 Chronic obstructive pulmonary disease, unspecified: Secondary | ICD-10-CM | POA: Diagnosis not present

## 2018-10-19 DIAGNOSIS — J449 Chronic obstructive pulmonary disease, unspecified: Secondary | ICD-10-CM | POA: Diagnosis not present

## 2018-10-19 DIAGNOSIS — J80 Acute respiratory distress syndrome: Secondary | ICD-10-CM | POA: Diagnosis not present

## 2018-10-21 DIAGNOSIS — J449 Chronic obstructive pulmonary disease, unspecified: Secondary | ICD-10-CM | POA: Diagnosis not present

## 2018-10-21 DIAGNOSIS — J80 Acute respiratory distress syndrome: Secondary | ICD-10-CM | POA: Diagnosis not present

## 2018-10-29 DIAGNOSIS — J449 Chronic obstructive pulmonary disease, unspecified: Secondary | ICD-10-CM | POA: Diagnosis not present

## 2018-11-03 DIAGNOSIS — G4733 Obstructive sleep apnea (adult) (pediatric): Secondary | ICD-10-CM | POA: Diagnosis not present

## 2018-11-03 DIAGNOSIS — E785 Hyperlipidemia, unspecified: Secondary | ICD-10-CM | POA: Diagnosis not present

## 2018-11-03 DIAGNOSIS — N3281 Overactive bladder: Secondary | ICD-10-CM | POA: Diagnosis not present

## 2018-11-03 DIAGNOSIS — F0390 Unspecified dementia without behavioral disturbance: Secondary | ICD-10-CM | POA: Diagnosis not present

## 2018-11-03 DIAGNOSIS — F418 Other specified anxiety disorders: Secondary | ICD-10-CM | POA: Diagnosis not present

## 2018-11-03 DIAGNOSIS — G43909 Migraine, unspecified, not intractable, without status migrainosus: Secondary | ICD-10-CM | POA: Diagnosis not present

## 2018-11-03 DIAGNOSIS — J449 Chronic obstructive pulmonary disease, unspecified: Secondary | ICD-10-CM | POA: Diagnosis not present

## 2018-11-03 DIAGNOSIS — E559 Vitamin D deficiency, unspecified: Secondary | ICD-10-CM | POA: Diagnosis not present

## 2018-11-18 DIAGNOSIS — J449 Chronic obstructive pulmonary disease, unspecified: Secondary | ICD-10-CM | POA: Diagnosis not present

## 2018-11-18 DIAGNOSIS — J80 Acute respiratory distress syndrome: Secondary | ICD-10-CM | POA: Diagnosis not present

## 2018-11-20 DIAGNOSIS — J80 Acute respiratory distress syndrome: Secondary | ICD-10-CM | POA: Diagnosis not present

## 2018-11-20 DIAGNOSIS — J449 Chronic obstructive pulmonary disease, unspecified: Secondary | ICD-10-CM | POA: Diagnosis not present

## 2018-11-26 DIAGNOSIS — J449 Chronic obstructive pulmonary disease, unspecified: Secondary | ICD-10-CM | POA: Diagnosis not present

## 2018-12-05 DIAGNOSIS — E7800 Pure hypercholesterolemia, unspecified: Secondary | ICD-10-CM | POA: Diagnosis not present

## 2018-12-05 DIAGNOSIS — G43909 Migraine, unspecified, not intractable, without status migrainosus: Secondary | ICD-10-CM | POA: Diagnosis not present

## 2018-12-05 DIAGNOSIS — G309 Alzheimer's disease, unspecified: Secondary | ICD-10-CM | POA: Diagnosis not present

## 2018-12-05 DIAGNOSIS — Z6827 Body mass index (BMI) 27.0-27.9, adult: Secondary | ICD-10-CM | POA: Diagnosis not present

## 2018-12-05 DIAGNOSIS — J449 Chronic obstructive pulmonary disease, unspecified: Secondary | ICD-10-CM | POA: Diagnosis not present

## 2018-12-05 DIAGNOSIS — F0280 Dementia in other diseases classified elsewhere without behavioral disturbance: Secondary | ICD-10-CM | POA: Diagnosis not present

## 2018-12-05 DIAGNOSIS — Z87891 Personal history of nicotine dependence: Secondary | ICD-10-CM | POA: Diagnosis not present

## 2018-12-05 DIAGNOSIS — Z299 Encounter for prophylactic measures, unspecified: Secondary | ICD-10-CM | POA: Diagnosis not present

## 2018-12-12 DIAGNOSIS — G4733 Obstructive sleep apnea (adult) (pediatric): Secondary | ICD-10-CM | POA: Diagnosis not present

## 2018-12-19 DIAGNOSIS — J449 Chronic obstructive pulmonary disease, unspecified: Secondary | ICD-10-CM | POA: Diagnosis not present

## 2018-12-19 DIAGNOSIS — J80 Acute respiratory distress syndrome: Secondary | ICD-10-CM | POA: Diagnosis not present

## 2018-12-21 DIAGNOSIS — J449 Chronic obstructive pulmonary disease, unspecified: Secondary | ICD-10-CM | POA: Diagnosis not present

## 2018-12-21 DIAGNOSIS — J80 Acute respiratory distress syndrome: Secondary | ICD-10-CM | POA: Diagnosis not present

## 2018-12-25 DIAGNOSIS — J449 Chronic obstructive pulmonary disease, unspecified: Secondary | ICD-10-CM | POA: Diagnosis not present

## 2019-01-17 DIAGNOSIS — J449 Chronic obstructive pulmonary disease, unspecified: Secondary | ICD-10-CM | POA: Diagnosis not present

## 2019-01-18 DIAGNOSIS — J449 Chronic obstructive pulmonary disease, unspecified: Secondary | ICD-10-CM | POA: Diagnosis not present

## 2019-01-18 DIAGNOSIS — J80 Acute respiratory distress syndrome: Secondary | ICD-10-CM | POA: Diagnosis not present

## 2019-02-18 DIAGNOSIS — J449 Chronic obstructive pulmonary disease, unspecified: Secondary | ICD-10-CM | POA: Diagnosis not present

## 2019-02-18 DIAGNOSIS — J80 Acute respiratory distress syndrome: Secondary | ICD-10-CM | POA: Diagnosis not present

## 2019-02-20 DIAGNOSIS — J449 Chronic obstructive pulmonary disease, unspecified: Secondary | ICD-10-CM | POA: Diagnosis not present

## 2019-02-20 DIAGNOSIS — J80 Acute respiratory distress syndrome: Secondary | ICD-10-CM | POA: Diagnosis not present

## 2019-03-12 DIAGNOSIS — G4733 Obstructive sleep apnea (adult) (pediatric): Secondary | ICD-10-CM | POA: Diagnosis not present

## 2019-03-21 DIAGNOSIS — J449 Chronic obstructive pulmonary disease, unspecified: Secondary | ICD-10-CM | POA: Diagnosis not present

## 2019-03-21 DIAGNOSIS — J80 Acute respiratory distress syndrome: Secondary | ICD-10-CM | POA: Diagnosis not present

## 2019-03-23 DIAGNOSIS — J80 Acute respiratory distress syndrome: Secondary | ICD-10-CM | POA: Diagnosis not present

## 2019-03-23 DIAGNOSIS — J449 Chronic obstructive pulmonary disease, unspecified: Secondary | ICD-10-CM | POA: Diagnosis not present

## 2019-04-09 DIAGNOSIS — J449 Chronic obstructive pulmonary disease, unspecified: Secondary | ICD-10-CM | POA: Diagnosis not present

## 2019-04-20 DIAGNOSIS — J80 Acute respiratory distress syndrome: Secondary | ICD-10-CM | POA: Diagnosis not present

## 2019-04-20 DIAGNOSIS — J449 Chronic obstructive pulmonary disease, unspecified: Secondary | ICD-10-CM | POA: Diagnosis not present

## 2019-04-22 DIAGNOSIS — J449 Chronic obstructive pulmonary disease, unspecified: Secondary | ICD-10-CM | POA: Diagnosis not present

## 2019-04-22 DIAGNOSIS — J80 Acute respiratory distress syndrome: Secondary | ICD-10-CM | POA: Diagnosis not present

## 2019-05-21 DIAGNOSIS — J449 Chronic obstructive pulmonary disease, unspecified: Secondary | ICD-10-CM | POA: Diagnosis not present

## 2019-05-21 DIAGNOSIS — J80 Acute respiratory distress syndrome: Secondary | ICD-10-CM | POA: Diagnosis not present

## 2019-05-23 DIAGNOSIS — J80 Acute respiratory distress syndrome: Secondary | ICD-10-CM | POA: Diagnosis not present

## 2019-05-23 DIAGNOSIS — J449 Chronic obstructive pulmonary disease, unspecified: Secondary | ICD-10-CM | POA: Diagnosis not present

## 2019-05-31 DIAGNOSIS — J4520 Mild intermittent asthma, uncomplicated: Secondary | ICD-10-CM | POA: Diagnosis not present

## 2019-05-31 DIAGNOSIS — J449 Chronic obstructive pulmonary disease, unspecified: Secondary | ICD-10-CM | POA: Diagnosis not present

## 2019-06-20 DIAGNOSIS — J449 Chronic obstructive pulmonary disease, unspecified: Secondary | ICD-10-CM | POA: Diagnosis not present

## 2019-06-20 DIAGNOSIS — J80 Acute respiratory distress syndrome: Secondary | ICD-10-CM | POA: Diagnosis not present

## 2019-06-22 DIAGNOSIS — J449 Chronic obstructive pulmonary disease, unspecified: Secondary | ICD-10-CM | POA: Diagnosis not present

## 2019-06-22 DIAGNOSIS — J80 Acute respiratory distress syndrome: Secondary | ICD-10-CM | POA: Diagnosis not present

## 2019-07-01 DIAGNOSIS — G4733 Obstructive sleep apnea (adult) (pediatric): Secondary | ICD-10-CM | POA: Diagnosis not present

## 2019-07-01 DIAGNOSIS — Z9981 Dependence on supplemental oxygen: Secondary | ICD-10-CM | POA: Diagnosis not present

## 2019-07-01 DIAGNOSIS — F0280 Dementia in other diseases classified elsewhere without behavioral disturbance: Secondary | ICD-10-CM | POA: Diagnosis not present

## 2019-07-01 DIAGNOSIS — G309 Alzheimer's disease, unspecified: Secondary | ICD-10-CM | POA: Diagnosis not present

## 2019-07-01 DIAGNOSIS — F418 Other specified anxiety disorders: Secondary | ICD-10-CM | POA: Diagnosis not present

## 2019-07-01 DIAGNOSIS — G43909 Migraine, unspecified, not intractable, without status migrainosus: Secondary | ICD-10-CM | POA: Diagnosis not present

## 2019-07-01 DIAGNOSIS — J449 Chronic obstructive pulmonary disease, unspecified: Secondary | ICD-10-CM | POA: Diagnosis not present

## 2019-07-01 DIAGNOSIS — Z90710 Acquired absence of both cervix and uterus: Secondary | ICD-10-CM | POA: Diagnosis not present

## 2019-07-01 DIAGNOSIS — E785 Hyperlipidemia, unspecified: Secondary | ICD-10-CM | POA: Diagnosis not present

## 2019-07-01 DIAGNOSIS — F325 Major depressive disorder, single episode, in full remission: Secondary | ICD-10-CM | POA: Diagnosis not present

## 2019-07-01 DIAGNOSIS — E559 Vitamin D deficiency, unspecified: Secondary | ICD-10-CM | POA: Diagnosis not present

## 2019-07-01 DIAGNOSIS — Z9849 Cataract extraction status, unspecified eye: Secondary | ICD-10-CM | POA: Diagnosis not present

## 2019-07-01 DIAGNOSIS — Z9049 Acquired absence of other specified parts of digestive tract: Secondary | ICD-10-CM | POA: Diagnosis not present

## 2019-07-09 DIAGNOSIS — J449 Chronic obstructive pulmonary disease, unspecified: Secondary | ICD-10-CM | POA: Diagnosis not present

## 2019-07-10 DIAGNOSIS — G4733 Obstructive sleep apnea (adult) (pediatric): Secondary | ICD-10-CM | POA: Diagnosis not present

## 2019-07-21 DIAGNOSIS — J449 Chronic obstructive pulmonary disease, unspecified: Secondary | ICD-10-CM | POA: Diagnosis not present

## 2019-07-21 DIAGNOSIS — J80 Acute respiratory distress syndrome: Secondary | ICD-10-CM | POA: Diagnosis not present

## 2019-07-23 DIAGNOSIS — J449 Chronic obstructive pulmonary disease, unspecified: Secondary | ICD-10-CM | POA: Diagnosis not present

## 2019-07-23 DIAGNOSIS — J80 Acute respiratory distress syndrome: Secondary | ICD-10-CM | POA: Diagnosis not present

## 2019-08-21 DIAGNOSIS — J80 Acute respiratory distress syndrome: Secondary | ICD-10-CM | POA: Diagnosis not present

## 2019-08-21 DIAGNOSIS — J449 Chronic obstructive pulmonary disease, unspecified: Secondary | ICD-10-CM | POA: Diagnosis not present

## 2019-08-23 DIAGNOSIS — J80 Acute respiratory distress syndrome: Secondary | ICD-10-CM | POA: Diagnosis not present

## 2019-08-23 DIAGNOSIS — J449 Chronic obstructive pulmonary disease, unspecified: Secondary | ICD-10-CM | POA: Diagnosis not present

## 2019-08-27 DIAGNOSIS — J449 Chronic obstructive pulmonary disease, unspecified: Secondary | ICD-10-CM | POA: Diagnosis not present

## 2019-09-18 DIAGNOSIS — J449 Chronic obstructive pulmonary disease, unspecified: Secondary | ICD-10-CM | POA: Diagnosis not present

## 2019-09-18 DIAGNOSIS — J80 Acute respiratory distress syndrome: Secondary | ICD-10-CM | POA: Diagnosis not present

## 2019-09-20 DIAGNOSIS — J449 Chronic obstructive pulmonary disease, unspecified: Secondary | ICD-10-CM | POA: Diagnosis not present

## 2019-09-20 DIAGNOSIS — J80 Acute respiratory distress syndrome: Secondary | ICD-10-CM | POA: Diagnosis not present

## 2019-10-08 DIAGNOSIS — G4733 Obstructive sleep apnea (adult) (pediatric): Secondary | ICD-10-CM | POA: Diagnosis not present

## 2019-10-19 DIAGNOSIS — J449 Chronic obstructive pulmonary disease, unspecified: Secondary | ICD-10-CM | POA: Diagnosis not present

## 2019-10-19 DIAGNOSIS — J80 Acute respiratory distress syndrome: Secondary | ICD-10-CM | POA: Diagnosis not present

## 2019-10-21 DIAGNOSIS — J449 Chronic obstructive pulmonary disease, unspecified: Secondary | ICD-10-CM | POA: Diagnosis not present

## 2019-10-21 DIAGNOSIS — J80 Acute respiratory distress syndrome: Secondary | ICD-10-CM | POA: Diagnosis not present

## 2019-11-18 DIAGNOSIS — J449 Chronic obstructive pulmonary disease, unspecified: Secondary | ICD-10-CM | POA: Diagnosis not present

## 2019-11-18 DIAGNOSIS — J80 Acute respiratory distress syndrome: Secondary | ICD-10-CM | POA: Diagnosis not present

## 2019-11-20 DIAGNOSIS — J449 Chronic obstructive pulmonary disease, unspecified: Secondary | ICD-10-CM | POA: Diagnosis not present

## 2019-11-20 DIAGNOSIS — J80 Acute respiratory distress syndrome: Secondary | ICD-10-CM | POA: Diagnosis not present

## 2019-12-19 DIAGNOSIS — J80 Acute respiratory distress syndrome: Secondary | ICD-10-CM | POA: Diagnosis not present

## 2019-12-19 DIAGNOSIS — J449 Chronic obstructive pulmonary disease, unspecified: Secondary | ICD-10-CM | POA: Diagnosis not present

## 2019-12-21 DIAGNOSIS — J80 Acute respiratory distress syndrome: Secondary | ICD-10-CM | POA: Diagnosis not present

## 2019-12-21 DIAGNOSIS — J449 Chronic obstructive pulmonary disease, unspecified: Secondary | ICD-10-CM | POA: Diagnosis not present

## 2020-01-18 DIAGNOSIS — J80 Acute respiratory distress syndrome: Secondary | ICD-10-CM | POA: Diagnosis not present

## 2020-01-18 DIAGNOSIS — J449 Chronic obstructive pulmonary disease, unspecified: Secondary | ICD-10-CM | POA: Diagnosis not present

## 2020-01-20 DIAGNOSIS — J449 Chronic obstructive pulmonary disease, unspecified: Secondary | ICD-10-CM | POA: Diagnosis not present

## 2020-01-20 DIAGNOSIS — J80 Acute respiratory distress syndrome: Secondary | ICD-10-CM | POA: Diagnosis not present

## 2020-02-18 DIAGNOSIS — J80 Acute respiratory distress syndrome: Secondary | ICD-10-CM | POA: Diagnosis not present

## 2020-02-18 DIAGNOSIS — J449 Chronic obstructive pulmonary disease, unspecified: Secondary | ICD-10-CM | POA: Diagnosis not present

## 2020-02-20 DIAGNOSIS — J80 Acute respiratory distress syndrome: Secondary | ICD-10-CM | POA: Diagnosis not present

## 2020-02-20 DIAGNOSIS — J449 Chronic obstructive pulmonary disease, unspecified: Secondary | ICD-10-CM | POA: Diagnosis not present

## 2020-03-20 DIAGNOSIS — J449 Chronic obstructive pulmonary disease, unspecified: Secondary | ICD-10-CM | POA: Diagnosis not present

## 2020-03-20 DIAGNOSIS — J80 Acute respiratory distress syndrome: Secondary | ICD-10-CM | POA: Diagnosis not present

## 2020-03-22 DIAGNOSIS — J80 Acute respiratory distress syndrome: Secondary | ICD-10-CM | POA: Diagnosis not present

## 2020-03-22 DIAGNOSIS — J449 Chronic obstructive pulmonary disease, unspecified: Secondary | ICD-10-CM | POA: Diagnosis not present

## 2020-04-19 DIAGNOSIS — J449 Chronic obstructive pulmonary disease, unspecified: Secondary | ICD-10-CM | POA: Diagnosis not present

## 2020-04-19 DIAGNOSIS — J80 Acute respiratory distress syndrome: Secondary | ICD-10-CM | POA: Diagnosis not present

## 2020-04-21 DIAGNOSIS — J80 Acute respiratory distress syndrome: Secondary | ICD-10-CM | POA: Diagnosis not present

## 2020-04-21 DIAGNOSIS — J449 Chronic obstructive pulmonary disease, unspecified: Secondary | ICD-10-CM | POA: Diagnosis not present

## 2020-05-20 DIAGNOSIS — J80 Acute respiratory distress syndrome: Secondary | ICD-10-CM | POA: Diagnosis not present

## 2020-05-20 DIAGNOSIS — J449 Chronic obstructive pulmonary disease, unspecified: Secondary | ICD-10-CM | POA: Diagnosis not present

## 2020-05-22 DIAGNOSIS — J80 Acute respiratory distress syndrome: Secondary | ICD-10-CM | POA: Diagnosis not present

## 2020-05-22 DIAGNOSIS — J449 Chronic obstructive pulmonary disease, unspecified: Secondary | ICD-10-CM | POA: Diagnosis not present
